# Patient Record
Sex: Male | Born: 1950 | Race: White | Hispanic: No | Marital: Married | Smoking: Never smoker
Health system: Southern US, Community
[De-identification: ages and names within clinical notes are randomized; demographics above are authoritative.]

## PROBLEM LIST (undated history)

## (undated) DIAGNOSIS — E785 Hyperlipidemia, unspecified: Secondary | ICD-10-CM

## (undated) DIAGNOSIS — M1990 Unspecified osteoarthritis, unspecified site: Secondary | ICD-10-CM

## (undated) DIAGNOSIS — I10 Essential (primary) hypertension: Secondary | ICD-10-CM

## (undated) DIAGNOSIS — K429 Umbilical hernia without obstruction or gangrene: Secondary | ICD-10-CM

## (undated) HISTORY — PX: HERNIA REPAIR: SHX51

## (undated) HISTORY — DX: Hyperlipidemia, unspecified: E78.5

## (undated) HISTORY — PX: HAND SURGERY: SHX662

## (undated) HISTORY — PX: APPENDECTOMY: SHX54

## (undated) HISTORY — DX: Umbilical hernia without obstruction or gangrene: K42.9

## (undated) HISTORY — DX: Unspecified osteoarthritis, unspecified site: M19.90

## (undated) HISTORY — PX: STOMACH SURGERY: SHX791

## (undated) HISTORY — PX: WRIST SURGERY: SHX841

## (undated) HISTORY — DX: Essential (primary) hypertension: I10

---

## 1997-10-05 ENCOUNTER — Ambulatory Visit (HOSPITAL_BASED_OUTPATIENT_CLINIC_OR_DEPARTMENT_OTHER): Admission: RE | Admit: 1997-10-05 | Discharge: 1997-10-05 | Payer: Self-pay | Admitting: Orthopedic Surgery

## 1998-04-12 ENCOUNTER — Ambulatory Visit (HOSPITAL_BASED_OUTPATIENT_CLINIC_OR_DEPARTMENT_OTHER): Admission: RE | Admit: 1998-04-12 | Discharge: 1998-04-12 | Payer: Self-pay | Admitting: Orthopedic Surgery

## 2005-05-19 ENCOUNTER — Emergency Department (HOSPITAL_COMMUNITY): Admission: EM | Admit: 2005-05-19 | Discharge: 2005-05-20 | Payer: Self-pay | Admitting: Emergency Medicine

## 2010-12-13 ENCOUNTER — Encounter (INDEPENDENT_AMBULATORY_CARE_PROVIDER_SITE_OTHER): Payer: Self-pay | Admitting: Surgery

## 2010-12-29 ENCOUNTER — Ambulatory Visit (INDEPENDENT_AMBULATORY_CARE_PROVIDER_SITE_OTHER): Payer: Self-pay | Admitting: Surgery

## 2011-01-04 ENCOUNTER — Encounter (INDEPENDENT_AMBULATORY_CARE_PROVIDER_SITE_OTHER): Payer: Self-pay | Admitting: Surgery

## 2011-01-04 ENCOUNTER — Ambulatory Visit (INDEPENDENT_AMBULATORY_CARE_PROVIDER_SITE_OTHER): Payer: 59 | Admitting: Surgery

## 2011-01-04 VITALS — BP 124/80 | HR 68 | Temp 97.1°F | Ht 67.0 in | Wt 211.0 lb

## 2011-01-04 DIAGNOSIS — K429 Umbilical hernia without obstruction or gangrene: Secondary | ICD-10-CM

## 2011-01-04 NOTE — Progress Notes (Addendum)
Chief Complaint  Patient presents with  . Other    new pt- eval umb hernia    Richard Peterson is a 60 y.o. male who is a patient of BURNETT,BRENT A, MD and comes to me today for umbilical hernia.  The patient has had a hernia for 2-3 years. It has gotten steadily larger. It is causing him some symptoms. He's coming to discuss having this hernia fixed.  From a gastrointestinal standpoint he had an intussusception as a 86-year-old child. He has a right paramedian incision for this. He had an appendectomy at age 47 through a low right inguinal incision.  He has no history of peptic ulcer disease, liver disease, pancreatic disease, or colon disease. Has undergone colonoscopies by Dr. Marisue Brooklyn.  Past Medical History  Diagnosis Date  . Umbilical hernia   . Hypertension   . Hyperlipidemia   . Hearing loss   . Arthritis     Past Surgical History  Procedure Date  . Appendectomy   . Hernia repair   . Stomach surgery   . Hand surgery     right  . Wrist surgery     left    Current Outpatient Prescriptions  Medication Sig Dispense Refill  . diltiazem (CARDIZEM CD) 360 MG 24 hr capsule Take 360 mg by mouth daily.        Marland Kitchen losartan-hydrochlorothiazide (HYZAAR) 100-25 MG per tablet Take 1 tablet by mouth daily.        . metolazone (ZAROXOLYN) 5 MG tablet Take 5 mg by mouth daily.        . Potassium Chloride Crys CR (KLOR-CON M20 PO) Take 20 mEq by mouth daily.        . pravastatin (PRAVACHOL) 40 MG tablet Take 40 mg by mouth daily.         Review of Systems: Skin:  No history of rash.  No history of abnormal moles. Infection:  No history of hepatitis or HIV.  No history of MRSA. Neurologic:  No history of stroke.  No history of seizure.  No history of headaches. Cardiac: Hstory of hypertension x 20 years.  No hx of heart disease.  No history of prior cardiac catheterization.  No history of seeing a cardiologist. Pulmonary:  Does not smoke cigarettes.  No asthma or bronchitis.  No  OSA/CPAP.  Endocrine:  No diabetes. No thyroid disease. Hypercholesterolemia. Gastrointestinal:  See HPI. Urologic:  Kidney stones approx 12 years ago.  No history of bladder infections. Musculoskeletal:  Larey Seat from tree stand many years ago and has back but is well tolerated. Hematologic:  No bleeding disorder.  No history of anemia.  Not anticoagulated.   No Known Allergies  SOCIAL and FAMILY HISTORY: Married. Works for [employer].  PHYSICAL EXAM: BP 124/80  Pulse 68  Temp(Src) 97.1 F (36.2 C) (Temporal)  Ht 5\' 7"  (1.702 m)  Wt 211 lb (95.709 kg)  BMI 33.05 kg/m2  HEENT: Normal. Pupils equal. Normal dentition. Neck: Supple. No thyroid mass. Lymph Nodes:  No supraclavicular or cervical nodes. Lungs: Clear and symmetric.  Heart:  RRR. No murmur. Abdomen: No mass. No tenderness. Right paramedian incision (old).  R inguinal scar.  Moderate umbilical hernia with thinned skin.  Normal bowel sounds.   Rectal: Not done. Extremities:  Good strength in upper and lower extremities. Neurologic:  Grossly intact to motor and sensory function. Skin:  Multiple seborrheic keratosis.   DATA REVIEWED: Dr. Mellody Life data.  ASSESSMENT and PLAN: #1. Umbilical hernia. Moderate size  hernia with thin skin over umbilical hernia. I suggest repairing his hernia. I will probably do this open but also discussed laparoscopic approach.  I discussed the indications and complications of hernia surgery with the patient.  I discussed both the laparoscopic and open approach to hernia repair..  The potential risks of hernia surgery include, but are not limited to, bleeding, infection, open surgery, nerve injury, and recurrence of the hernia.  I provided the patient literature about hernia surgery.  He travels a lot with his job.  And we will work around his travels.  #2. Hypertension. #3. Hypercholesterolemia. #4. Remote history kidney stones. #5. 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

## 2011-01-04 NOTE — Patient Instructions (Signed)
To schedule umbilical hernia repair.  Literature given about hernias.

## 2011-01-19 DIAGNOSIS — K420 Umbilical hernia with obstruction, without gangrene: Secondary | ICD-10-CM

## 2011-01-24 ENCOUNTER — Telehealth (INDEPENDENT_AMBULATORY_CARE_PROVIDER_SITE_OTHER): Payer: Self-pay | Admitting: General Surgery

## 2011-01-24 NOTE — Telephone Encounter (Signed)
Patient called s/p umbilical hernia repair last Friday 01/19/11. Patient states he has done everything he was told. He took a shower Monday and took the bandage off. Stated the incision looked good at that point. Patient went back to work yesterday without any heavy lifting. Patient is sore but no significant pain. Patient took a shower this morning and noticed that his umbilical is bulging. Not bulging at incision line, but at his belly button. He can push this area back in. Please advise.

## 2011-01-24 NOTE — Telephone Encounter (Signed)
I called pt back and informed him that this was most likely a fluid collection or seroma.  This should resolve on it's own.  I told him to call us if pain worsens or he has any signs of infection.  He has a follow up on 9-6

## 2011-01-24 NOTE — Telephone Encounter (Signed)
Patient called back upset that he has not heard back from Dr Ezzard Standing or anyone regarding his phone call. After talking to Huntley Dec I told patient that Ezzard Standing had to leave office quickly after clinic and is suppose to call Huntley Dec back and give direction for patient. Patient wasn't happy and said if he doesn't hear anything from [place] today he will be up here in the office when our doors open tomorrow to be seen from someone.

## 2011-01-31 ENCOUNTER — Ambulatory Visit (INDEPENDENT_AMBULATORY_CARE_PROVIDER_SITE_OTHER): Payer: 59 | Admitting: Surgery

## 2011-01-31 ENCOUNTER — Encounter (INDEPENDENT_AMBULATORY_CARE_PROVIDER_SITE_OTHER): Payer: Self-pay | Admitting: Surgery

## 2011-01-31 VITALS — BP 122/76 | HR 77

## 2011-01-31 DIAGNOSIS — Z09 Encounter for follow-up examination after completed treatment for conditions other than malignant neoplasm: Secondary | ICD-10-CM

## 2011-01-31 NOTE — Progress Notes (Signed)
ASSESSMENT AND PLAN: 1. Umbilical hernia, repaired 01/19/2011.  Seroma under skin which should resolve.  Encouraged to wear the abdominal binder for about one month from surgery.  Path benign and copy to patient.  To return if the umbilicus does not flatten out.  2. Hypertension 3. Hypercholesterolemia.  4. Remote history kidney stones.  5. 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 Follow-up    Richard Peterson is a 60 y.o. (DOB: [birth year])  white male who is a patient of BURNETT,BRENT A, MD and comes to me today for follow up of umbilical hernia repair.  Has done well, but about 1 week post op noticed that his skin stuck out.  No pain.  Eating okay.  Only wore the abdominal binder for about 1 week.  PHYSICAL EXAM: BP 122/76  Pulse 77  Abdomen:  Incision well healed.  Has small seroma under skin.  DATA REVIEWED: Path report given to patient.

## 2016-12-25 DIAGNOSIS — E7800 Pure hypercholesterolemia, unspecified: Secondary | ICD-10-CM | POA: Diagnosis not present

## 2016-12-25 DIAGNOSIS — Z0000 Encounter for general adult medical examination without abnormal findings: Secondary | ICD-10-CM | POA: Diagnosis not present

## 2016-12-25 DIAGNOSIS — N529 Male erectile dysfunction, unspecified: Secondary | ICD-10-CM | POA: Diagnosis not present

## 2016-12-25 DIAGNOSIS — Z125 Encounter for screening for malignant neoplasm of prostate: Secondary | ICD-10-CM | POA: Diagnosis not present

## 2016-12-25 DIAGNOSIS — I10 Essential (primary) hypertension: Secondary | ICD-10-CM | POA: Diagnosis not present

## 2017-06-20 DIAGNOSIS — I10 Essential (primary) hypertension: Secondary | ICD-10-CM | POA: Diagnosis not present

## 2017-06-20 DIAGNOSIS — E7800 Pure hypercholesterolemia, unspecified: Secondary | ICD-10-CM | POA: Diagnosis not present

## 2017-06-24 DIAGNOSIS — L989 Disorder of the skin and subcutaneous tissue, unspecified: Secondary | ICD-10-CM | POA: Diagnosis not present

## 2017-06-24 DIAGNOSIS — E7800 Pure hypercholesterolemia, unspecified: Secondary | ICD-10-CM | POA: Diagnosis not present

## 2017-06-24 DIAGNOSIS — I10 Essential (primary) hypertension: Secondary | ICD-10-CM | POA: Diagnosis not present

## 2017-06-24 DIAGNOSIS — N529 Male erectile dysfunction, unspecified: Secondary | ICD-10-CM | POA: Diagnosis not present

## 2017-07-17 DIAGNOSIS — H2513 Age-related nuclear cataract, bilateral: Secondary | ICD-10-CM | POA: Diagnosis not present

## 2017-07-17 DIAGNOSIS — H40013 Open angle with borderline findings, low risk, bilateral: Secondary | ICD-10-CM | POA: Diagnosis not present

## 2017-10-14 DIAGNOSIS — D485 Neoplasm of uncertain behavior of skin: Secondary | ICD-10-CM | POA: Diagnosis not present

## 2017-10-14 DIAGNOSIS — D0462 Carcinoma in situ of skin of left upper limb, including shoulder: Secondary | ICD-10-CM | POA: Diagnosis not present

## 2017-10-14 DIAGNOSIS — D225 Melanocytic nevi of trunk: Secondary | ICD-10-CM | POA: Diagnosis not present

## 2017-10-14 DIAGNOSIS — D044 Carcinoma in situ of skin of scalp and neck: Secondary | ICD-10-CM | POA: Diagnosis not present

## 2017-10-14 DIAGNOSIS — D2261 Melanocytic nevi of right upper limb, including shoulder: Secondary | ICD-10-CM | POA: Diagnosis not present

## 2017-10-14 DIAGNOSIS — D1801 Hemangioma of skin and subcutaneous tissue: Secondary | ICD-10-CM | POA: Diagnosis not present

## 2017-10-14 DIAGNOSIS — L821 Other seborrheic keratosis: Secondary | ICD-10-CM | POA: Diagnosis not present

## 2017-10-14 DIAGNOSIS — L570 Actinic keratosis: Secondary | ICD-10-CM | POA: Diagnosis not present

## 2017-12-16 DIAGNOSIS — L570 Actinic keratosis: Secondary | ICD-10-CM | POA: Diagnosis not present

## 2017-12-16 DIAGNOSIS — D225 Melanocytic nevi of trunk: Secondary | ICD-10-CM | POA: Diagnosis not present

## 2017-12-16 DIAGNOSIS — Z85828 Personal history of other malignant neoplasm of skin: Secondary | ICD-10-CM | POA: Diagnosis not present

## 2017-12-16 DIAGNOSIS — D1801 Hemangioma of skin and subcutaneous tissue: Secondary | ICD-10-CM | POA: Diagnosis not present

## 2017-12-26 DIAGNOSIS — Z125 Encounter for screening for malignant neoplasm of prostate: Secondary | ICD-10-CM | POA: Diagnosis not present

## 2017-12-26 DIAGNOSIS — Z79899 Other long term (current) drug therapy: Secondary | ICD-10-CM | POA: Diagnosis not present

## 2017-12-26 DIAGNOSIS — E7800 Pure hypercholesterolemia, unspecified: Secondary | ICD-10-CM | POA: Diagnosis not present

## 2017-12-26 DIAGNOSIS — R718 Other abnormality of red blood cells: Secondary | ICD-10-CM | POA: Diagnosis not present

## 2017-12-26 DIAGNOSIS — I10 Essential (primary) hypertension: Secondary | ICD-10-CM | POA: Diagnosis not present

## 2017-12-27 DIAGNOSIS — I10 Essential (primary) hypertension: Secondary | ICD-10-CM | POA: Diagnosis not present

## 2017-12-27 DIAGNOSIS — N529 Male erectile dysfunction, unspecified: Secondary | ICD-10-CM | POA: Diagnosis not present

## 2017-12-27 DIAGNOSIS — Z0000 Encounter for general adult medical examination without abnormal findings: Secondary | ICD-10-CM | POA: Diagnosis not present

## 2017-12-27 DIAGNOSIS — Z125 Encounter for screening for malignant neoplasm of prostate: Secondary | ICD-10-CM | POA: Diagnosis not present

## 2017-12-27 DIAGNOSIS — R5383 Other fatigue: Secondary | ICD-10-CM | POA: Diagnosis not present

## 2017-12-27 DIAGNOSIS — E7800 Pure hypercholesterolemia, unspecified: Secondary | ICD-10-CM | POA: Diagnosis not present

## 2018-05-28 HISTORY — PX: CATARACT EXTRACTION W/ INTRAOCULAR LENS  IMPLANT, BILATERAL: SHX1307

## 2018-06-12 DIAGNOSIS — Z86010 Personal history of colonic polyps: Secondary | ICD-10-CM | POA: Diagnosis not present

## 2018-06-12 DIAGNOSIS — Z1211 Encounter for screening for malignant neoplasm of colon: Secondary | ICD-10-CM | POA: Diagnosis not present

## 2018-06-12 DIAGNOSIS — E669 Obesity, unspecified: Secondary | ICD-10-CM | POA: Diagnosis not present

## 2018-06-12 DIAGNOSIS — K5730 Diverticulosis of large intestine without perforation or abscess without bleeding: Secondary | ICD-10-CM | POA: Diagnosis not present

## 2018-06-26 DIAGNOSIS — D2262 Melanocytic nevi of left upper limb, including shoulder: Secondary | ICD-10-CM | POA: Diagnosis not present

## 2018-06-26 DIAGNOSIS — D225 Melanocytic nevi of trunk: Secondary | ICD-10-CM | POA: Diagnosis not present

## 2018-06-26 DIAGNOSIS — L309 Dermatitis, unspecified: Secondary | ICD-10-CM | POA: Diagnosis not present

## 2018-06-26 DIAGNOSIS — L570 Actinic keratosis: Secondary | ICD-10-CM | POA: Diagnosis not present

## 2018-06-26 DIAGNOSIS — L821 Other seborrheic keratosis: Secondary | ICD-10-CM | POA: Diagnosis not present

## 2018-06-26 DIAGNOSIS — Z85828 Personal history of other malignant neoplasm of skin: Secondary | ICD-10-CM | POA: Diagnosis not present

## 2018-06-26 DIAGNOSIS — D2261 Melanocytic nevi of right upper limb, including shoulder: Secondary | ICD-10-CM | POA: Diagnosis not present

## 2018-06-26 DIAGNOSIS — D1801 Hemangioma of skin and subcutaneous tissue: Secondary | ICD-10-CM | POA: Diagnosis not present

## 2018-06-30 DIAGNOSIS — E7800 Pure hypercholesterolemia, unspecified: Secondary | ICD-10-CM | POA: Diagnosis not present

## 2018-06-30 DIAGNOSIS — I10 Essential (primary) hypertension: Secondary | ICD-10-CM | POA: Diagnosis not present

## 2018-06-30 DIAGNOSIS — R5383 Other fatigue: Secondary | ICD-10-CM | POA: Diagnosis not present

## 2018-07-01 DIAGNOSIS — R718 Other abnormality of red blood cells: Secondary | ICD-10-CM | POA: Diagnosis not present

## 2018-07-01 DIAGNOSIS — E7800 Pure hypercholesterolemia, unspecified: Secondary | ICD-10-CM | POA: Diagnosis not present

## 2018-07-01 DIAGNOSIS — N529 Male erectile dysfunction, unspecified: Secondary | ICD-10-CM | POA: Diagnosis not present

## 2018-07-01 DIAGNOSIS — I10 Essential (primary) hypertension: Secondary | ICD-10-CM | POA: Diagnosis not present

## 2018-08-11 DIAGNOSIS — K5730 Diverticulosis of large intestine without perforation or abscess without bleeding: Secondary | ICD-10-CM | POA: Diagnosis not present

## 2018-08-11 DIAGNOSIS — Z1211 Encounter for screening for malignant neoplasm of colon: Secondary | ICD-10-CM | POA: Diagnosis not present

## 2018-08-11 DIAGNOSIS — Z86010 Personal history of colonic polyps: Secondary | ICD-10-CM | POA: Diagnosis not present

## 2018-09-15 DIAGNOSIS — H6123 Impacted cerumen, bilateral: Secondary | ICD-10-CM | POA: Diagnosis not present

## 2018-10-31 DIAGNOSIS — R300 Dysuria: Secondary | ICD-10-CM | POA: Diagnosis not present

## 2018-10-31 DIAGNOSIS — R319 Hematuria, unspecified: Secondary | ICD-10-CM | POA: Diagnosis not present

## 2018-10-31 DIAGNOSIS — M545 Low back pain: Secondary | ICD-10-CM | POA: Diagnosis not present

## 2018-11-18 DIAGNOSIS — M545 Low back pain: Secondary | ICD-10-CM | POA: Diagnosis not present

## 2018-11-20 DIAGNOSIS — M545 Low back pain: Secondary | ICD-10-CM | POA: Diagnosis not present

## 2018-11-20 DIAGNOSIS — M9905 Segmental and somatic dysfunction of pelvic region: Secondary | ICD-10-CM | POA: Diagnosis not present

## 2018-11-20 DIAGNOSIS — M9903 Segmental and somatic dysfunction of lumbar region: Secondary | ICD-10-CM | POA: Diagnosis not present

## 2018-11-20 DIAGNOSIS — M9902 Segmental and somatic dysfunction of thoracic region: Secondary | ICD-10-CM | POA: Diagnosis not present

## 2018-11-25 DIAGNOSIS — M9903 Segmental and somatic dysfunction of lumbar region: Secondary | ICD-10-CM | POA: Diagnosis not present

## 2018-11-25 DIAGNOSIS — M9902 Segmental and somatic dysfunction of thoracic region: Secondary | ICD-10-CM | POA: Diagnosis not present

## 2018-11-25 DIAGNOSIS — M545 Low back pain: Secondary | ICD-10-CM | POA: Diagnosis not present

## 2018-11-25 DIAGNOSIS — M9905 Segmental and somatic dysfunction of pelvic region: Secondary | ICD-10-CM | POA: Diagnosis not present

## 2018-12-26 DIAGNOSIS — R718 Other abnormality of red blood cells: Secondary | ICD-10-CM | POA: Diagnosis not present

## 2018-12-26 DIAGNOSIS — E559 Vitamin D deficiency, unspecified: Secondary | ICD-10-CM | POA: Diagnosis not present

## 2018-12-26 DIAGNOSIS — E7800 Pure hypercholesterolemia, unspecified: Secondary | ICD-10-CM | POA: Diagnosis not present

## 2018-12-26 DIAGNOSIS — I10 Essential (primary) hypertension: Secondary | ICD-10-CM | POA: Diagnosis not present

## 2018-12-30 DIAGNOSIS — Z0000 Encounter for general adult medical examination without abnormal findings: Secondary | ICD-10-CM | POA: Diagnosis not present

## 2018-12-30 DIAGNOSIS — N529 Male erectile dysfunction, unspecified: Secondary | ICD-10-CM | POA: Diagnosis not present

## 2018-12-30 DIAGNOSIS — I10 Essential (primary) hypertension: Secondary | ICD-10-CM | POA: Diagnosis not present

## 2018-12-30 DIAGNOSIS — R718 Other abnormality of red blood cells: Secondary | ICD-10-CM | POA: Diagnosis not present

## 2018-12-30 DIAGNOSIS — Z7289 Other problems related to lifestyle: Secondary | ICD-10-CM | POA: Diagnosis not present

## 2018-12-30 DIAGNOSIS — E7800 Pure hypercholesterolemia, unspecified: Secondary | ICD-10-CM | POA: Diagnosis not present

## 2018-12-30 DIAGNOSIS — E559 Vitamin D deficiency, unspecified: Secondary | ICD-10-CM | POA: Diagnosis not present

## 2019-01-30 DIAGNOSIS — H524 Presbyopia: Secondary | ICD-10-CM | POA: Diagnosis not present

## 2019-01-30 DIAGNOSIS — H40013 Open angle with borderline findings, low risk, bilateral: Secondary | ICD-10-CM | POA: Diagnosis not present

## 2019-01-30 DIAGNOSIS — H2513 Age-related nuclear cataract, bilateral: Secondary | ICD-10-CM | POA: Diagnosis not present

## 2019-02-17 DIAGNOSIS — H25013 Cortical age-related cataract, bilateral: Secondary | ICD-10-CM | POA: Diagnosis not present

## 2019-02-17 DIAGNOSIS — H2511 Age-related nuclear cataract, right eye: Secondary | ICD-10-CM | POA: Diagnosis not present

## 2019-02-17 DIAGNOSIS — H2513 Age-related nuclear cataract, bilateral: Secondary | ICD-10-CM | POA: Diagnosis not present

## 2019-02-17 DIAGNOSIS — H25043 Posterior subcapsular polar age-related cataract, bilateral: Secondary | ICD-10-CM | POA: Diagnosis not present

## 2019-02-17 DIAGNOSIS — H18413 Arcus senilis, bilateral: Secondary | ICD-10-CM | POA: Diagnosis not present

## 2019-03-09 DIAGNOSIS — H2511 Age-related nuclear cataract, right eye: Secondary | ICD-10-CM | POA: Diagnosis not present

## 2019-03-09 DIAGNOSIS — H2513 Age-related nuclear cataract, bilateral: Secondary | ICD-10-CM | POA: Diagnosis not present

## 2019-03-10 DIAGNOSIS — H2512 Age-related nuclear cataract, left eye: Secondary | ICD-10-CM | POA: Diagnosis not present

## 2019-03-23 DIAGNOSIS — H2512 Age-related nuclear cataract, left eye: Secondary | ICD-10-CM | POA: Diagnosis not present

## 2019-03-23 DIAGNOSIS — H2513 Age-related nuclear cataract, bilateral: Secondary | ICD-10-CM | POA: Diagnosis not present

## 2019-04-21 DIAGNOSIS — H20011 Primary iridocyclitis, right eye: Secondary | ICD-10-CM | POA: Diagnosis not present

## 2019-06-29 DIAGNOSIS — E559 Vitamin D deficiency, unspecified: Secondary | ICD-10-CM | POA: Diagnosis not present

## 2019-06-29 DIAGNOSIS — I10 Essential (primary) hypertension: Secondary | ICD-10-CM | POA: Diagnosis not present

## 2019-06-29 DIAGNOSIS — Z125 Encounter for screening for malignant neoplasm of prostate: Secondary | ICD-10-CM | POA: Diagnosis not present

## 2019-06-29 DIAGNOSIS — E7800 Pure hypercholesterolemia, unspecified: Secondary | ICD-10-CM | POA: Diagnosis not present

## 2019-07-01 DIAGNOSIS — R718 Other abnormality of red blood cells: Secondary | ICD-10-CM | POA: Diagnosis not present

## 2019-07-01 DIAGNOSIS — I10 Essential (primary) hypertension: Secondary | ICD-10-CM | POA: Diagnosis not present

## 2019-07-01 DIAGNOSIS — N529 Male erectile dysfunction, unspecified: Secondary | ICD-10-CM | POA: Diagnosis not present

## 2019-07-01 DIAGNOSIS — Z8639 Personal history of other endocrine, nutritional and metabolic disease: Secondary | ICD-10-CM | POA: Diagnosis not present

## 2019-07-01 DIAGNOSIS — E7800 Pure hypercholesterolemia, unspecified: Secondary | ICD-10-CM | POA: Diagnosis not present

## 2019-07-08 DIAGNOSIS — D2262 Melanocytic nevi of left upper limb, including shoulder: Secondary | ICD-10-CM | POA: Diagnosis not present

## 2019-07-08 DIAGNOSIS — L570 Actinic keratosis: Secondary | ICD-10-CM | POA: Diagnosis not present

## 2019-07-08 DIAGNOSIS — L821 Other seborrheic keratosis: Secondary | ICD-10-CM | POA: Diagnosis not present

## 2019-07-08 DIAGNOSIS — D225 Melanocytic nevi of trunk: Secondary | ICD-10-CM | POA: Diagnosis not present

## 2019-07-08 DIAGNOSIS — D485 Neoplasm of uncertain behavior of skin: Secondary | ICD-10-CM | POA: Diagnosis not present

## 2019-07-08 DIAGNOSIS — D2239 Melanocytic nevi of other parts of face: Secondary | ICD-10-CM | POA: Diagnosis not present

## 2019-07-14 DIAGNOSIS — D485 Neoplasm of uncertain behavior of skin: Secondary | ICD-10-CM | POA: Diagnosis not present

## 2019-07-14 DIAGNOSIS — L988 Other specified disorders of the skin and subcutaneous tissue: Secondary | ICD-10-CM | POA: Diagnosis not present

## 2019-12-10 DIAGNOSIS — Z20822 Contact with and (suspected) exposure to covid-19: Secondary | ICD-10-CM | POA: Diagnosis not present

## 2019-12-10 DIAGNOSIS — Z03818 Encounter for observation for suspected exposure to other biological agents ruled out: Secondary | ICD-10-CM | POA: Diagnosis not present

## 2019-12-11 DIAGNOSIS — J069 Acute upper respiratory infection, unspecified: Secondary | ICD-10-CM | POA: Diagnosis not present

## 2019-12-11 DIAGNOSIS — R350 Frequency of micturition: Secondary | ICD-10-CM | POA: Diagnosis not present

## 2019-12-30 DIAGNOSIS — I10 Essential (primary) hypertension: Secondary | ICD-10-CM | POA: Diagnosis not present

## 2019-12-30 DIAGNOSIS — Z1159 Encounter for screening for other viral diseases: Secondary | ICD-10-CM | POA: Diagnosis not present

## 2019-12-30 DIAGNOSIS — E559 Vitamin D deficiency, unspecified: Secondary | ICD-10-CM | POA: Diagnosis not present

## 2019-12-30 DIAGNOSIS — Z0000 Encounter for general adult medical examination without abnormal findings: Secondary | ICD-10-CM | POA: Diagnosis not present

## 2019-12-30 DIAGNOSIS — N529 Male erectile dysfunction, unspecified: Secondary | ICD-10-CM | POA: Diagnosis not present

## 2019-12-30 DIAGNOSIS — R718 Other abnormality of red blood cells: Secondary | ICD-10-CM | POA: Diagnosis not present

## 2019-12-30 DIAGNOSIS — E7800 Pure hypercholesterolemia, unspecified: Secondary | ICD-10-CM | POA: Diagnosis not present

## 2020-06-20 ENCOUNTER — Other Ambulatory Visit: Payer: Self-pay | Admitting: Physician Assistant

## 2020-06-20 DIAGNOSIS — R1905 Periumbilic swelling, mass or lump: Secondary | ICD-10-CM

## 2020-06-21 ENCOUNTER — Ambulatory Visit
Admission: RE | Admit: 2020-06-21 | Discharge: 2020-06-21 | Disposition: A | Discharge status: Home / Self Care | Payer: PPO | Source: Ambulatory Visit | Attending: Physician Assistant | Admitting: Physician Assistant

## 2020-06-21 DIAGNOSIS — K429 Umbilical hernia without obstruction or gangrene: Secondary | ICD-10-CM | POA: Diagnosis not present

## 2020-06-21 DIAGNOSIS — R1905 Periumbilic swelling, mass or lump: Secondary | ICD-10-CM

## 2020-07-07 DIAGNOSIS — L570 Actinic keratosis: Secondary | ICD-10-CM | POA: Diagnosis not present

## 2020-07-07 DIAGNOSIS — D1801 Hemangioma of skin and subcutaneous tissue: Secondary | ICD-10-CM | POA: Diagnosis not present

## 2020-07-07 DIAGNOSIS — D2262 Melanocytic nevi of left upper limb, including shoulder: Secondary | ICD-10-CM | POA: Diagnosis not present

## 2020-07-07 DIAGNOSIS — L821 Other seborrheic keratosis: Secondary | ICD-10-CM | POA: Diagnosis not present

## 2020-07-07 DIAGNOSIS — D485 Neoplasm of uncertain behavior of skin: Secondary | ICD-10-CM | POA: Diagnosis not present

## 2020-07-07 DIAGNOSIS — D225 Melanocytic nevi of trunk: Secondary | ICD-10-CM | POA: Diagnosis not present

## 2020-07-07 DIAGNOSIS — D2261 Melanocytic nevi of right upper limb, including shoulder: Secondary | ICD-10-CM | POA: Diagnosis not present

## 2020-07-14 DIAGNOSIS — R1905 Periumbilic swelling, mass or lump: Secondary | ICD-10-CM | POA: Diagnosis not present

## 2020-07-19 ENCOUNTER — Other Ambulatory Visit: Payer: Self-pay | Admitting: General Surgery

## 2020-07-19 DIAGNOSIS — R1905 Periumbilic swelling, mass or lump: Secondary | ICD-10-CM

## 2020-07-26 DIAGNOSIS — E559 Vitamin D deficiency, unspecified: Secondary | ICD-10-CM | POA: Diagnosis not present

## 2020-07-26 DIAGNOSIS — N529 Male erectile dysfunction, unspecified: Secondary | ICD-10-CM | POA: Diagnosis not present

## 2020-07-26 DIAGNOSIS — I10 Essential (primary) hypertension: Secondary | ICD-10-CM | POA: Diagnosis not present

## 2020-07-26 DIAGNOSIS — R718 Other abnormality of red blood cells: Secondary | ICD-10-CM | POA: Diagnosis not present

## 2020-07-26 DIAGNOSIS — E7800 Pure hypercholesterolemia, unspecified: Secondary | ICD-10-CM | POA: Diagnosis not present

## 2020-07-26 DIAGNOSIS — Z125 Encounter for screening for malignant neoplasm of prostate: Secondary | ICD-10-CM | POA: Diagnosis not present

## 2020-08-04 ENCOUNTER — Other Ambulatory Visit: Payer: Self-pay

## 2020-08-04 ENCOUNTER — Ambulatory Visit
Admission: RE | Admit: 2020-08-04 | Discharge: 2020-08-04 | Disposition: A | Discharge status: Home / Self Care | Payer: PPO | Source: Ambulatory Visit | Attending: General Surgery | Admitting: General Surgery

## 2020-08-04 DIAGNOSIS — R1905 Periumbilic swelling, mass or lump: Secondary | ICD-10-CM

## 2020-08-04 DIAGNOSIS — N1330 Unspecified hydronephrosis: Secondary | ICD-10-CM | POA: Diagnosis not present

## 2020-09-20 DIAGNOSIS — N130 Hydronephrosis with ureteropelvic junction obstruction: Secondary | ICD-10-CM | POA: Diagnosis not present

## 2020-09-20 DIAGNOSIS — N312 Flaccid neuropathic bladder, not elsewhere classified: Secondary | ICD-10-CM | POA: Diagnosis not present

## 2020-09-22 DIAGNOSIS — R310 Gross hematuria: Secondary | ICD-10-CM | POA: Diagnosis not present

## 2020-09-22 DIAGNOSIS — R3914 Feeling of incomplete bladder emptying: Secondary | ICD-10-CM | POA: Diagnosis not present

## 2020-10-10 DIAGNOSIS — R338 Other retention of urine: Secondary | ICD-10-CM | POA: Diagnosis not present

## 2020-10-17 DIAGNOSIS — N401 Enlarged prostate with lower urinary tract symptoms: Secondary | ICD-10-CM | POA: Diagnosis not present

## 2020-10-17 DIAGNOSIS — N130 Hydronephrosis with ureteropelvic junction obstruction: Secondary | ICD-10-CM | POA: Diagnosis not present

## 2020-10-17 DIAGNOSIS — R3914 Feeling of incomplete bladder emptying: Secondary | ICD-10-CM | POA: Diagnosis not present

## 2020-10-26 ENCOUNTER — Other Ambulatory Visit: Payer: Self-pay | Admitting: Urology

## 2020-10-26 DIAGNOSIS — I959 Hypotension, unspecified: Secondary | ICD-10-CM | POA: Diagnosis not present

## 2020-11-04 ENCOUNTER — Encounter (HOSPITAL_BASED_OUTPATIENT_CLINIC_OR_DEPARTMENT_OTHER): Payer: Self-pay | Admitting: Urology

## 2020-11-04 ENCOUNTER — Other Ambulatory Visit: Payer: Self-pay

## 2020-11-04 NOTE — Progress Notes (Signed)
Spoke w/ via phone for pre-op interview--- Pt Lab needs dos----  no             Lab results------ pt getting CBC, BMP, EKG done 11-07-2020 @ 0930 COVID test--- 11-07-2020 @ 0840 (pt going @ 0800) Arrive at -------  0830 on 11-09-2020 NPO after MN NO Solid Food.  Clear liquids from MN until--- 0730 Med rec completed Medications to take morning of surgery ----- Pravastatin Diabetic medication ----- n/a Patient instructed no nail polish to be worn day of surgery Patient instructed to bring photo id and insurance card day of surgery Patient aware to have Driver (ride ) / caregiver for 24 hours after surgery -- wife, [facility] Patient Special Instructions ----- reviewed [facility] and visitor guidelines Pre-Op special Istructions ----- n/a Patient verbalized understanding of instructions that were given at this phone interview. Patient denies shortness of breath, chest pain, fever, cough at this phone interview.

## 2020-11-07 ENCOUNTER — Other Ambulatory Visit: Payer: Self-pay

## 2020-11-07 ENCOUNTER — Other Ambulatory Visit (HOSPITAL_COMMUNITY)
Admission: RE | Admit: 2020-11-07 | Discharge: 2020-11-07 | Disposition: A | Discharge status: Home / Self Care | Payer: PPO | Source: Ambulatory Visit | Attending: Urology | Admitting: Urology

## 2020-11-07 ENCOUNTER — Encounter (HOSPITAL_COMMUNITY)
Admission: RE | Admit: 2020-11-07 | Discharge: 2020-11-07 | Disposition: A | Discharge status: Home / Self Care | Payer: PPO | Source: Ambulatory Visit | Attending: Urology | Admitting: Urology

## 2020-11-07 DIAGNOSIS — Z20822 Contact with and (suspected) exposure to covid-19: Secondary | ICD-10-CM | POA: Diagnosis not present

## 2020-11-07 DIAGNOSIS — Z01818 Encounter for other preprocedural examination: Secondary | ICD-10-CM | POA: Diagnosis not present

## 2020-11-07 LAB — BASIC METABOLIC PANEL
Anion gap: 7 (ref 5–15)
BUN: 14 mg/dL (ref 8–23)
CO2: 34 mmol/L — ABNORMAL HIGH (ref 22–32)
Calcium: 9.9 mg/dL (ref 8.9–10.3)
Chloride: 95 mmol/L — ABNORMAL LOW (ref 98–111)
Creatinine, Ser: 1.12 mg/dL (ref 0.61–1.24)
GFR, Estimated: >60 mL/min (ref >60)
Glucose, Bld: 86 mg/dL (ref 70–99)
Potassium: 4.1 mmol/L (ref 3.5–5.1)
Sodium: 136 mmol/L (ref 135–145)

## 2020-11-07 LAB — CBC
HCT: 41.9 % (ref 39.0–52.0)
Hemoglobin: 14.5 g/dL (ref 13.0–17.0)
MCH: 34.7 pg — ABNORMAL HIGH (ref 26.0–34.0)
MCHC: 34.6 g/dL (ref 30.0–36.0)
MCV: 100.2 fL — ABNORMAL HIGH (ref 80.0–100.0)
Platelets: 202 10*3/uL (ref 150–400)
RBC: 4.18 MIL/uL — ABNORMAL LOW (ref 4.22–5.81)
RDW: 12.7 % (ref 11.5–15.5)
WBC: 4.7 10*3/uL (ref 4.0–10.5)
nRBC: 0 % (ref 0.0–0.2)

## 2020-11-07 LAB — SARS CORONAVIRUS 2 (TAT 6-24 HRS): SARS Coronavirus 2: NEGATIVE

## 2020-11-09 ENCOUNTER — Other Ambulatory Visit: Payer: Self-pay

## 2020-11-09 ENCOUNTER — Ambulatory Visit (HOSPITAL_BASED_OUTPATIENT_CLINIC_OR_DEPARTMENT_OTHER): Payer: PPO | Admitting: Anesthesiology

## 2020-11-09 ENCOUNTER — Encounter (HOSPITAL_BASED_OUTPATIENT_CLINIC_OR_DEPARTMENT_OTHER): Payer: Self-pay | Admitting: Urology

## 2020-11-09 ENCOUNTER — Observation Stay (HOSPITAL_BASED_OUTPATIENT_CLINIC_OR_DEPARTMENT_OTHER)
Admission: RE | Admit: 2020-11-09 | Discharge: 2020-11-10 | Disposition: A | Discharge status: Home / Self Care | Payer: PPO | Attending: Urology | Admitting: Urology

## 2020-11-09 ENCOUNTER — Encounter (HOSPITAL_BASED_OUTPATIENT_CLINIC_OR_DEPARTMENT_OTHER)
Admission: RE | Disposition: A | Discharge status: Home / Self Care | Payer: Self-pay | Source: Home / Self Care | Attending: Urology

## 2020-11-09 DIAGNOSIS — N3080 Other cystitis without hematuria: Secondary | ICD-10-CM | POA: Diagnosis not present

## 2020-11-09 DIAGNOSIS — D304 Benign neoplasm of urethra: Secondary | ICD-10-CM | POA: Diagnosis not present

## 2020-11-09 DIAGNOSIS — I10 Essential (primary) hypertension: Secondary | ICD-10-CM | POA: Diagnosis not present

## 2020-11-09 DIAGNOSIS — R3914 Feeling of incomplete bladder emptying: Secondary | ICD-10-CM | POA: Diagnosis not present

## 2020-11-09 DIAGNOSIS — N130 Hydronephrosis with ureteropelvic junction obstruction: Secondary | ICD-10-CM | POA: Diagnosis not present

## 2020-11-09 DIAGNOSIS — Z79899 Other long term (current) drug therapy: Secondary | ICD-10-CM | POA: Diagnosis not present

## 2020-11-09 DIAGNOSIS — N400 Enlarged prostate without lower urinary tract symptoms: Secondary | ICD-10-CM | POA: Diagnosis not present

## 2020-11-09 DIAGNOSIS — N401 Enlarged prostate with lower urinary tract symptoms: Secondary | ICD-10-CM | POA: Diagnosis not present

## 2020-11-09 DIAGNOSIS — N529 Male erectile dysfunction, unspecified: Secondary | ICD-10-CM | POA: Diagnosis not present

## 2020-11-09 DIAGNOSIS — R338 Other retention of urine: Secondary | ICD-10-CM | POA: Diagnosis not present

## 2020-11-09 DIAGNOSIS — E785 Hyperlipidemia, unspecified: Secondary | ICD-10-CM | POA: Diagnosis not present

## 2020-11-09 DIAGNOSIS — N3289 Other specified disorders of bladder: Secondary | ICD-10-CM | POA: Diagnosis not present

## 2020-11-09 HISTORY — PX: TRANSURETHRAL RESECTION OF PROSTATE: SHX73

## 2020-11-09 SURGERY — TURP (TRANSURETHRAL RESECTION OF PROSTATE)
Anesthesia: General | Site: Prostate

## 2020-11-09 MED ORDER — FENTANYL CITRATE (PF) 100 MCG/2ML IJ SOLN
25.0000 ug | INTRAMUSCULAR | Status: DC | PRN
  Administered 2020-11-09: 25 ug via INTRAVENOUS

## 2020-11-09 MED ORDER — BELLADONNA ALKALOIDS-OPIUM 16.2-60 MG RE SUPP
RECTAL | Status: DC | PRN
  Administered 2020-11-09: 1 via RECTAL

## 2020-11-09 MED ORDER — GLYCOPYRROLATE PF 0.2 MG/ML IJ SOSY
PREFILLED_SYRINGE | INTRAMUSCULAR | Status: AC
  Filled 2020-11-09: qty 1

## 2020-11-09 MED ORDER — SODIUM CHLORIDE 0.9 % IV SOLN: INTRAVENOUS | Status: DC

## 2020-11-09 MED ORDER — LIDOCAINE HCL (PF) 2 % IJ SOLN
INTRAMUSCULAR | Status: AC
  Filled 2020-11-09: qty 5

## 2020-11-09 MED ORDER — PROPOFOL 10 MG/ML IV BOLUS
INTRAVENOUS | Status: DC | PRN
  Administered 2020-11-09: 150 mg via INTRAVENOUS
  Administered 2020-11-09: 30 mg via INTRAVENOUS
  Administered 2020-11-09: 20 mg via INTRAVENOUS

## 2020-11-09 MED ORDER — BELLADONNA ALKALOIDS-OPIUM 16.2-60 MG RE SUPP
RECTAL | Status: AC
  Filled 2020-11-09: qty 1

## 2020-11-09 MED ORDER — DEXAMETHASONE SODIUM PHOSPHATE 10 MG/ML IJ SOLN
INTRAMUSCULAR | Status: AC
  Filled 2020-11-09: qty 1

## 2020-11-09 MED ORDER — BELLADONNA ALKALOIDS-OPIUM 16.2-60 MG RE SUPP: 1.0000 | Freq: Four times a day (QID) | RECTAL | Status: DC | PRN

## 2020-11-09 MED ORDER — GLYCOPYRROLATE 0.2 MG/ML IJ SOLN
INTRAMUSCULAR | Status: DC | PRN
  Administered 2020-11-09: .2 mg via INTRAVENOUS

## 2020-11-09 MED ORDER — FENTANYL CITRATE (PF) 100 MCG/2ML IJ SOLN
INTRAMUSCULAR | Status: DC | PRN
  Administered 2020-11-09: 50 ug via INTRAVENOUS
  Administered 2020-11-09 (×2): 25 ug via INTRAVENOUS

## 2020-11-09 MED ORDER — DIPHENHYDRAMINE HCL 50 MG/ML IJ SOLN: 12.5000 mg | Freq: Four times a day (QID) | INTRAMUSCULAR | Status: DC | PRN

## 2020-11-09 MED ORDER — SODIUM CHLORIDE 0.9 % IR SOLN: 3000.0000 mL | Status: DC

## 2020-11-09 MED ORDER — ZOLPIDEM TARTRATE 5 MG PO TABS
ORAL_TABLET | ORAL | Status: AC
  Filled 2020-11-09: qty 1

## 2020-11-09 MED ORDER — ZOLPIDEM TARTRATE 5 MG PO TABS
5.0000 mg | ORAL_TABLET | Freq: Every evening | ORAL | Status: DC | PRN
  Administered 2020-11-09: 5 mg via ORAL

## 2020-11-09 MED ORDER — ACETAMINOPHEN 325 MG PO TABS
650.0000 mg | ORAL_TABLET | ORAL | Status: DC | PRN
  Administered 2020-11-09 – 2020-11-10 (×3): 650 mg via ORAL

## 2020-11-09 MED ORDER — FENTANYL CITRATE (PF) 100 MCG/2ML IJ SOLN
INTRAMUSCULAR | Status: AC
  Filled 2020-11-09: qty 2

## 2020-11-09 MED ORDER — CIPROFLOXACIN IN D5W 400 MG/200ML IV SOLN
400.0000 mg | Freq: Once | INTRAVENOUS | Status: AC
  Administered 2020-11-09: 400 mg via INTRAVENOUS

## 2020-11-09 MED ORDER — ONDANSETRON HCL 4 MG/2ML IJ SOLN
INTRAMUSCULAR | Status: DC | PRN
  Administered 2020-11-09: 4 mg via INTRAVENOUS

## 2020-11-09 MED ORDER — DEXAMETHASONE SODIUM PHOSPHATE 4 MG/ML IJ SOLN
INTRAMUSCULAR | Status: DC | PRN
  Administered 2020-11-09: 10 mg via INTRAVENOUS

## 2020-11-09 MED ORDER — ACETAMINOPHEN 500 MG PO TABS
1000.0000 mg | ORAL_TABLET | Freq: Once | ORAL | Status: AC
  Administered 2020-11-09: 1000 mg via ORAL

## 2020-11-09 MED ORDER — ACETAMINOPHEN 500 MG PO TABS
ORAL_TABLET | ORAL | Status: AC
  Filled 2020-11-09: qty 1

## 2020-11-09 MED ORDER — EPHEDRINE SULFATE 50 MG/ML IJ SOLN
INTRAMUSCULAR | Status: DC | PRN
  Administered 2020-11-09: 5 mg via INTRAVENOUS
  Administered 2020-11-09: 10 mg via INTRAVENOUS

## 2020-11-09 MED ORDER — HYDROMORPHONE HCL 1 MG/ML IJ SOLN: 0.5000 mg | INTRAMUSCULAR | Status: DC | PRN

## 2020-11-09 MED ORDER — ACETAMINOPHEN 325 MG PO TABS
ORAL_TABLET | ORAL | Status: AC
  Filled 2020-11-09: qty 2

## 2020-11-09 MED ORDER — LISINOPRIL 20 MG PO TABS
20.0000 mg | ORAL_TABLET | Freq: Every day | ORAL | Status: DC
  Administered 2020-11-09: 20 mg via ORAL
  Filled 2020-11-09: qty 1

## 2020-11-09 MED ORDER — TAMSULOSIN HCL 0.4 MG PO CAPS
0.4000 mg | ORAL_CAPSULE | Freq: Every day | ORAL | Status: DC
  Administered 2020-11-10: 0.4 mg via ORAL

## 2020-11-09 MED ORDER — CIPROFLOXACIN IN D5W 400 MG/200ML IV SOLN
INTRAVENOUS | Status: AC
  Filled 2020-11-09: qty 200

## 2020-11-09 MED ORDER — OXYBUTYNIN CHLORIDE 5 MG PO TABS: 5.0000 mg | ORAL_TABLET | Freq: Three times a day (TID) | ORAL | Status: DC | PRN

## 2020-11-09 MED ORDER — CIPROFLOXACIN HCL 500 MG PO TABS
ORAL_TABLET | ORAL | Status: AC
  Filled 2020-11-09: qty 1

## 2020-11-09 MED ORDER — ONDANSETRON HCL 4 MG/2ML IJ SOLN
INTRAMUSCULAR | Status: AC
  Filled 2020-11-09: qty 2

## 2020-11-09 MED ORDER — HYDROCODONE-ACETAMINOPHEN 5-325 MG PO TABS: 1.0000 | ORAL_TABLET | ORAL | Status: DC | PRN

## 2020-11-09 MED ORDER — DIPHENHYDRAMINE HCL 25 MG PO CAPS: 50.0000 mg | ORAL_CAPSULE | Freq: Every day | ORAL | Status: DC

## 2020-11-09 MED ORDER — ACETAMINOPHEN 325 MG PO TABS
ORAL_TABLET | ORAL | Status: AC
  Filled 2020-11-09: qty 1

## 2020-11-09 MED ORDER — PHENYLEPHRINE 40 MCG/ML (10ML) SYRINGE FOR IV PUSH (FOR BLOOD PRESSURE SUPPORT)
PREFILLED_SYRINGE | INTRAVENOUS | Status: DC | PRN
  Administered 2020-11-09: 80 ug via INTRAVENOUS
  Administered 2020-11-09: 120 ug via INTRAVENOUS

## 2020-11-09 MED ORDER — SODIUM CHLORIDE 0.9 % IR SOLN
Status: DC | PRN
  Administered 2020-11-09 (×2): 6000 mL via INTRAVESICAL
  Administered 2020-11-09: 3000 mL via INTRAVESICAL
  Administered 2020-11-09: 1000 mL via INTRAVESICAL

## 2020-11-09 MED ORDER — CIPROFLOXACIN HCL 500 MG PO TABS
500.0000 mg | ORAL_TABLET | Freq: Two times a day (BID) | ORAL | Status: DC
  Administered 2020-11-09 – 2020-11-10 (×3): 500 mg via ORAL

## 2020-11-09 MED ORDER — PRAVASTATIN SODIUM 40 MG PO TABS
40.0000 mg | ORAL_TABLET | Freq: Every day | ORAL | Status: DC
Start: 2020-11-10 — End: 2020-11-10
  Filled 2020-11-09: qty 1

## 2020-11-09 MED ORDER — LISINOPRIL-HYDROCHLOROTHIAZIDE 20-25 MG PO TABS
1.0000 | ORAL_TABLET | Freq: Every day | ORAL | Status: DC
Start: 2020-11-09 — End: 2020-11-09

## 2020-11-09 MED ORDER — ONDANSETRON HCL 4 MG/2ML IJ SOLN: 4.0000 mg | INTRAMUSCULAR | Status: DC | PRN

## 2020-11-09 MED ORDER — PROPOFOL 10 MG/ML IV BOLUS
INTRAVENOUS | Status: AC
  Filled 2020-11-09: qty 20

## 2020-11-09 MED ORDER — DIPHENHYDRAMINE HCL 12.5 MG/5ML PO ELIX: 12.5000 mg | ORAL_SOLUTION | Freq: Four times a day (QID) | ORAL | Status: DC | PRN

## 2020-11-09 MED ORDER — HYDROCHLOROTHIAZIDE 25 MG PO TABS
25.0000 mg | ORAL_TABLET | Freq: Every day | ORAL | Status: DC
  Administered 2020-11-09: 25 mg via ORAL
  Filled 2020-11-09: qty 1

## 2020-11-09 MED ORDER — LACTATED RINGERS IV SOLN: INTRAVENOUS | Status: DC

## 2020-11-09 MED ORDER — LIDOCAINE HCL (CARDIAC) PF 100 MG/5ML IV SOSY
PREFILLED_SYRINGE | INTRAVENOUS | Status: DC | PRN
  Administered 2020-11-09: 60 mg via INTRAVENOUS

## 2020-11-09 SURGICAL SUPPLY — 25 items
BAG DRAIN URO-CYSTO SKYTR STRL (DRAIN) ×3 IMPLANT
BAG DRN RND TRDRP ANRFLXCHMBR (UROLOGICAL SUPPLIES) ×1
BAG DRN UROCATH (DRAIN) ×1
BAG URINE DRAIN 2000ML AR STRL (UROLOGICAL SUPPLIES) ×3 IMPLANT
BAND INSRT 18 STRL LF DISP RB (MISCELLANEOUS)
BAND RUBBER #18 3X1/16 STRL (MISCELLANEOUS) IMPLANT
CATH FOLEY 3WAY 30CC 22FR (CATHETERS) IMPLANT
CATH FOLEY 3WAY 30CC 24FR (CATHETERS) ×3
CATH URTH STD 24FR FL 3W 2 (CATHETERS) ×1 IMPLANT
DRSG TELFA 3X8 NADH (GAUZE/BANDAGES/DRESSINGS) ×3 IMPLANT
GLOVE SURG ENC MOIS LTX SZ7.5 (GLOVE) ×3 IMPLANT
GOWN STRL REUS W/TWL XL LVL3 (GOWN DISPOSABLE) ×3 IMPLANT
HOLDER FOLEY CATH W/STRAP (MISCELLANEOUS) ×3 IMPLANT
IV NS IRRIG 3000ML ARTHROMATIC (IV SOLUTION) ×24 IMPLANT
KIT TURNOVER CYSTO (KITS) ×3 IMPLANT
LOOP CUT BIPOLAR 24F LRG (ELECTROSURGICAL) ×3 IMPLANT
MANIFOLD NEPTUNE II (INSTRUMENTS) ×3 IMPLANT
PACK CYSTO (CUSTOM PROCEDURE TRAY) ×3 IMPLANT
PIN SAFETY STERILE (MISCELLANEOUS) ×3 IMPLANT
SYR 30ML LL (SYRINGE) ×3 IMPLANT
SYR TOOMEY IRRIG 70ML (MISCELLANEOUS) ×3
SYRINGE TOOMEY IRRIG 70ML (MISCELLANEOUS) ×1 IMPLANT
TUBE CONNECTING 12'X1/4 (SUCTIONS) ×1
TUBE CONNECTING 12X1/4 (SUCTIONS) ×2 IMPLANT
TUBING UROLOGY SET (TUBING) ×3 IMPLANT

## 2020-11-09 NOTE — Anesthesia Preprocedure Evaluation (Addendum)
Anesthesia Evaluation  Patient identified by MRN, date of birth, ID band Patient awake    Reviewed: Allergy & Precautions, H&P , NPO status , Patient's Chart, lab work & pertinent test results  Airway Mallampati: I  TM Distance: >3 FB Neck ROM: Full    Dental no notable dental hx. (+) Teeth Intact, Dental Advisory Given   Pulmonary neg pulmonary ROS,    Pulmonary exam normal breath sounds clear to auscultation       Cardiovascular hypertension, Pt. on medications  Rhythm:Regular Rate:Normal     Neuro/Psych negative neurological ROS  negative psych ROS   GI/Hepatic negative GI ROS, Neg liver ROS,   Endo/Other  negative endocrine ROS  Renal/GU negative Renal ROS  negative genitourinary   Musculoskeletal  (+) Arthritis ,   Abdominal   Peds  Hematology negative hematology ROS (+)   Anesthesia Other Findings   Reproductive/Obstetrics negative OB ROS                            Anesthesia Physical Anesthesia Plan  ASA: 2  Anesthesia Plan: General   Post-op Pain Management:    Induction: Intravenous  PONV Risk Score and Plan: 3 and Ondansetron, Dexamethasone and Treatment may vary due to age or medical condition  Airway Management Planned: LMA  Additional Equipment:   Intra-op Plan:   Post-operative Plan: Extubation in OR  Informed Consent: I have reviewed the patients History and Physical, chart, labs and discussed the procedure including the risks, benefits and alternatives for the proposed anesthesia with the patient or authorized representative who has indicated his/her understanding and acceptance.     Dental advisory given  Plan Discussed with: CRNA  Anesthesia Plan Comments:         Anesthesia Quick Evaluation

## 2020-11-09 NOTE — Anesthesia Postprocedure Evaluation (Signed)
Anesthesia Post Note  Patient: Richard Peterson.  Procedure(s) Performed: TRANSURETHRAL RESECTION OF THE PROSTATE (TURP)/ BIPOLAR (Prostate)     Patient location during evaluation: PACU Anesthesia Type: General Level of consciousness: awake and alert Pain management: pain level controlled Vital Signs Assessment: post-procedure vital signs reviewed and stable Respiratory status: spontaneous breathing, nonlabored ventilation and respiratory function stable Cardiovascular status: blood pressure returned to baseline and stable Postop Assessment: no apparent nausea or vomiting Anesthetic complications: no   No notable events documented.  Last Vitals:  Vitals:   11/09/20 1339 11/09/20 1351  BP: (!) 143/86 (!) 165/88  Pulse: 66 71  Resp: 12 14  Temp:  36.5 C  SpO2: 98% 97%    Last Pain:  Vitals:   11/09/20 1351  TempSrc: Oral  PainSc:                  Richard Peterson,W. EDMOND

## 2020-11-09 NOTE — Anesthesia Procedure Notes (Signed)
Procedure Name: LMA Insertion Date/Time: 11/09/2020 11:36 AM Performed by: Mechele Claude, CRNA Pre-anesthesia Checklist: Patient identified, Emergency Drugs available, Suction available and Patient being monitored Patient Re-evaluated:Patient Re-evaluated prior to induction Oxygen Delivery Method: Circle system utilized Preoxygenation: Pre-oxygenation with 100% oxygen Induction Type: IV induction Ventilation: Mask ventilation without difficulty LMA: LMA inserted LMA Size: 4.0 Number of attempts: 1 Airway Equipment and Method: Bite block Placement Confirmation: positive ETCO2 Tube secured with: Tape Dental Injury: Teeth and Oropharynx as per pre-operative assessment

## 2020-11-09 NOTE — Op Note (Signed)
Operative Note  Preoperative diagnosis:  1.  BPH with bladder outlet obstruction  Postoperative diagnosis: 1.  BPH with bladder outlet obstruction 2.  Diffuse papillary mucosal changes likely representing mucosal edema versus small papillary tumors  Procedure(s): 1.  Bipolar TURP  Surgeon: Ellison Hughs, MD  Assistants:  None  Anesthesia:  General  Complications:  None  EBL: 50 mL  Specimens: 1. Prostate chips 2.  Bladder biopsy 3.  Prostatic urethral biopsy  Drains/Catheters: 1.  39 French three-way Foley catheter with 30 mL in the balloon  Intraoperative findings:   Cystoscopy revealed diffuse mucosal edema versus small papillary bladder tumors throughout the bladder along with a small focus in the prostatic urethra Moderate to severe bladder trabeculation  Indication:  Richard Peterson. is a 70 y.o. male who was initially referred due to bilateral hydronephrosis along with a diffusely distended bladder.  He was evaluated in the office and found to have a PVR greater than 999 mL.  He has remote history of gross hematuria and had a negative CT and cystoscopy in 2011.  He also has a prior history of UTIs with his last one being approximately 10 years ago.  Due to his profoundly elevated PVR, he had an indwelling Foley catheter placed leading up to surgery.  He is here today for TURP.  He has been consented for the above procedures, voices understanding and wishes to proceed.  Description of procedure:  After informed consent was obtained, the patient was brought to the operating room and general anesthesia was administered. The patient was then placed in the dorsolithotomy position and prepped and draped in usual sterile fashion. A timeout was performed. A 23 French rigid cystoscope was then inserted into the urethral meatus and advanced into the bladder under direct vision. A complete bladder survey revealed the findings listed above.  Both ureteral orifices were  identified and well away from the bladder neck. The bipolar loop was then used to obtain a bladder biopsy along the posterior bladder wall as well as within the prostatic urethra.  The specimens were passed off and sent to pathology for permanent section  The rigid cystoscope was then exchanged for a 26 French resectoscope with a bipolar loop working element.  Starting at the bladder neck and progressing distally to the verumontanum, the prostatic adenoma was systematically resected until a widely patent prostatic urethral channel was created.  All prostate chips were then hand irrigated out of the bladder and sent to pathology for permanent section.  The resectoscope was then removed and exchanged for a 22 French three-way Foley catheter.  The three-way Foley catheter was then extensively hand irrigated until the irrigant returned clear to light pink.  The catheter was then placed to continuous bladder irrigation and placed on rubber band traction.  A belladonna and opium suppository was placed prior to the cessation of anesthesia.  He tolerated the procedure well and was transferred to the postanesthesia unit in stable condition.  Plan:  CBI overnight

## 2020-11-09 NOTE — H&P (Signed)
PRE-OP H&P  Office Visit Report     10/17/2020   --------------------------------------------------------------------------------   Richard Peterson  MRN: [id number]  DOB: 10/16/1950, 70 year old Male  PRIMARY CARE:  [facility], [place]  REFERRING:  Georgette Dover, MD  PROVIDER:  Ellison Hughs, M.D.  LOCATION:  [facility] [phone]     --------------------------------------------------------------------------------   CC/HPI: CC: bladder distention   HPI:   09/20/2020: [name] is a 70 year old male referred by Dr. Marlou Starks after he was found to have profound bladder distention and bilateral hydronephrosis on CT from 08/05/20.   -The patient was last seen in 2011 for an evaluation of gross hematuria, which was ultimately negative. At that time, he was found to have a PVR greater than 300 mL.  -Over the past 10+ years, the patient reports a fluctuating force of stream, but denies any sensation of incomplete bladder emptying. He reports a "kidney infection" approximately 10 years ago, but none since. He denies any interval episodes of dysuria or gross hematuria. UA today is unremarkable.  -3 month history of abdominal distention/fullness  -Hx of cervical spine fracture ~30 years ago with sporadic episodes of parathesias in his fingers--otherwise no significant neurologic issues.  -No prior history of GU surgery or trauma   PVR >967mL. Foley placed.   10/17/20: The patient is here today for a routine follow-up. His recent UDS study showed a compliant, very large capacity bladder with adequate detrusor contractility. Unfortunately, the patient was not able to generate a strong enough contraction to voluntarily void and required replacement of his Foley catheter. He denies any interval issues with discomfort around the catheter or with gross hematuria. He also denies flank pain, nausea/vomiting or fever/chills.   Last PSA: 0.83 (07/26/2020)  Last serum  creatinine: 1.3 (07/26/2020)     ALLERGIES: No Allergies    MEDICATIONS: Bactrim Ds 800 mg-160 mg tablet 1 tablet PO BID  Metoprolol Tartrate 50 mg tablet  Tamsulosin Hcl 0.4 mg capsule 1 capsule PO Daily  Cardizem CD 360 MG Oral Capsule Extended Release 24 Hour Oral  Glucosamine CAPS Oral  Klor-Con M20 20 MEQ Oral Tablet Extended Release Oral  Lisinopril-Hydrochlorothiazide 20 mg-12.5 mg tablet  Losartan Potassium-HCTZ 100-25 MG Oral Tablet Oral  Magnesium  MetOLazone 5 MG Oral Tablet Oral  Potassium  Pravastatin Sodium 40 mg tablet  Sildenafil Citrate 20 mg tablet  Vitamin B12  Vitamin D2     GU PSH: Complex cystometrogram, w/ void pressure and urethral pressure profile studies, any technique - 10/10/2020 Complex Uroflow - 10/10/2020 Emg surf Electrd - 10/10/2020 Inject For cystogram - 10/10/2020 Intrabd voidng Press - 10/10/2020       PSH Notes: Hand Surgery, Appendectomy, Intestinal Surgery, Wrist Surgery (2002), screws in right hand (2007), Ankle recontruction (2012)   NON-GU PSH: Appendectomy - 2011 Hernia Repair Unlisted Procedure Intestine - 2011     GU PMH: Urinary Retention - 10/10/2020 Gross hematuria, I offered to change his foley and he declined. He will return as planned. He was inst to use neosporin around meatus and take some Azo as needed. 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, Hypotonic bladder, - 2014 BPH w/LUTS - 09/20/2020 Hydronephrosis - 09/20/2020 History of urolithiasis, Nephrolithiasis - 2014 Obstructive and reflux uropathy, Unspec, Obstructive uropathy - 2014 Renal cyst, Renal cyst, acquired, right - 2014      PMH Notes:  1898-05-28 00:00:00 - Note: Normal Routine History And Physical  Adult  2010-01-12 15:16:54 - Note: Gout  2010-01-12 15:16:54 - Note: Arthritis   NON-GU PMH: Personal history of other diseases of the circulatory system, History of hypertension -  2014 Arthritis GERD Gout Hypercholesterolemia Hypertension    FAMILY HISTORY: 1 Daughter - Daughter 3 Son's - Son Alzheimer's Disease - Mother Death In The Family Father - Runs In Family Death In The Family Mother - Runs In Family Emphysema - Father Family Health Status Number - Runs In Family nephrolithiasis - Father   SOCIAL HISTORY: Marital Status: Married Preferred Language: English; Ethnicity: Not Hispanic Or Latino; Race: White Current Smoking Status: Patient has never smoked.   Tobacco Use Assessment Completed: Used Tobacco in last 30 days? Social Drinker.  Drinks 2 caffeinated drinks per day. Patient's occupation is/was Retired.     Notes: Alcohol Use, Tobacco Use, Occupation:, Caffeine Use, Marital History - Currently Married   REVIEW OF SYSTEMS:    GU Review Male:   Patient denies frequent urination, hard to postpone urination, burning/ pain with urination, get up at night to urinate, leakage of urine, stream starts and stops, trouble starting your stream, have to strain to urinate , erection problems, and penile pain.  Gastrointestinal (Upper):   Patient denies nausea, vomiting, and indigestion/ heartburn.  Gastrointestinal (Lower):   Patient denies diarrhea and constipation.  Constitutional:   Patient denies fever, night sweats, weight loss, and fatigue.  Skin:   Patient denies itching and skin rash/ lesion.  Eyes:   Patient denies blurred vision and double vision.  Ears/ Nose/ Throat:   Patient denies sore throat and sinus problems.  Hematologic/Lymphatic:   Patient denies swollen glands and easy bruising.  Cardiovascular:   Patient denies leg swelling and chest pains.  Respiratory:   Patient denies cough and shortness of breath.  Endocrine:   Patient denies excessive thirst.  Musculoskeletal:   Patient denies back pain and joint pain.  Neurological:   Patient denies headaches and dizziness.  Psychologic:   Patient denies depression and anxiety.   VITAL SIGNS:       10/17/2020 08:37 AM  Weight 166 lb / 75.3 kg  Height 67 in / 170.18 cm  BP 143/83 mmHg  Pulse 75 /min  Temperature 97.3 F / 36.2 C  BMI 26.0 kg/m   GU PHYSICAL EXAMINATION:    Scrotum: No lesions. No edema. No cysts. No warts.  Urethral Meatus: Normal size. No lesion, no wart, no discharge, no polyp. Normal location.  Penis: Penile foley catheter present - draining clear-yellow urine. Circumcised, no foreskin warts, no cracks. No dorsal peyronie's plaques, no left corporal peyronie's plaques, no right corporal peyronie's plaques, no scarring, no shaft warts. No balanitis, no meatal stenosis.    MULTI-SYSTEM PHYSICAL EXAMINATION:    Constitutional: Well-nourished. No physical deformities. Normally developed. Good grooming.  Neck: Neck symmetrical, not swollen. Normal tracheal position.  Respiratory: No labored breathing, no use of accessory muscles.   Cardiovascular: Normal temperature, normal extremity pulses, no swelling, no varicosities.  Skin: No paleness, no jaundice, no cyanosis. No lesion, no ulcer, no rash.  Neurologic / Psychiatric: Oriented to time, oriented to place, oriented to person. No depression, no anxiety, no agitation.  Gastrointestinal: No mass, no tenderness, no rigidity, non obese abdomen.     Complexity of Data:  Records Review:   Previous Patient Records  Urodynamics Review:   Review Urodynamics Tests   PROCEDURES:         Renal Ultrasound - 85462  Right Kidney: Length: 12.1 cm  Depth: 7.6 cm Cortical Width: 2.1 cm Width: 6.1 cm  Left Kidney: Length: 13.2 cm Depth: 7.4 cm Cortical Width: 1.6 cm Width: 5.0 cm  Left Kidney/Ureter:  Moderate hydro  Right Kidney/Ureter:  Mild hydro  Bladder:  Foley Catheter      Patient confirmed No Neulasta OnPro Device.   Renal ultrasound today shows persistent left greater than right sided hydronephrosis, which will likely be a chronic process due to his longstanding history of profound incomplete bladder  emptying. No solid parenchymal renal lesions are seen, bilaterally. Slight thinning of the left renal parenchyma. Bladder is visualized with Foley catheter in place. No intravesical lesions are appreciated.         Urinalysis w/Scope - 81001 Dipstick Dipstick Cont'd Micro  Color: Yellow Bilirubin: Neg WBC/hpf: 0 - 5/hpf  Appearance: Slightly Cloudy Ketones: Neg RBC/hpf: 0 - 2/hpf  Specific Gravity: 1.020 Blood: Neg Bacteria: Mod (26-50/hpf)  pH: 7.5 Protein: 1+ Cystals: Amorph Phosphates  Glucose: Neg Urobilinogen: 0.2 Casts: NS (Not Seen)    Nitrites: Neg Trichomonas: Not Present    Leukocyte Esterase: Trace Mucous: Not Present      Epithelial Cells: 0 - 5/hpf      Yeast: NS (Not Seen)      Sperm: Not Present    Notes:      ASSESSMENT:      ICD-10 Details  1 GU:   BPH w/LUTS - N40.1   2   Incomplete bladder emptying - R39.14   3   Hydronephrosis - N13.0    PLAN:            Medications New Meds: Bactrim Ds 800 mg-160 mg tablet 1 tablet PO BID As Directed Start taking 3 days prior to surgery  #6  0 Refill(s)            Orders Labs Urine Culture  X-Rays: Renal Ultrasound          Schedule Return Visit/Planned Activity: Next Available Appointment - Schedule Surgery          Document Letter(s):  Created for Patient: Clinical Summary         Notes:   -Renal ultrasound today shows persistent, bilateral hydronephrosis, which is likely to be a chronic process for him given his longstanding history of bladder outlet obstruction.  -UDS results discussed with the patient  -The risks, benefits and alternatives of cystoscopy with TURP was discussed with the patient. The risks included, but are not limited to, bleeding, urinary tract infection, bladder perforation requiring prolonged catheterization and/or open bladder repair, ureteral injury, ureteral obstruction, urethral stricture disease, new or worsening voiding dysfunction, retrograde ejaculation, MI, CVA, PE, DVT and the  inherent risks of general anesthesia. We also discussed the need for Foley catheterization for at least 3 days post-op and the likely need for post-op observation in the hospital following the procedure. The patient voices understanding and wishes to proceed.

## 2020-11-09 NOTE — Transfer of Care (Signed)
Immediate Anesthesia Transfer of Care Note  Patient: Richard Peterson.  Procedure(s) Performed: Procedure(s) (LRB): TRANSURETHRAL RESECTION OF THE PROSTATE (TURP)/ BIPOLAR (N/A)  Patient Location: PACU  Anesthesia Type: General  Level of Consciousness: awake, alert  and oriented  Airway & Oxygen Therapy: Patient Spontanous Breathing and Patient connected to nasal cannula oxygen  Post-op Assessment: Report given to PACU RN and Post -op Vital signs reviewed and stable  Post vital signs: Reviewed and stable  Complications: No apparent anesthesia complications  Last Vitals:  Vitals Value Taken Time  BP 111/86 11/09/20 1250  Temp 36.3 C 11/09/20 1250  Pulse 78 11/09/20 1253  Resp 15 11/09/20 1253  SpO2 98 % 11/09/20 1253  Vitals shown include unvalidated device data.  Last Pain:  Vitals:   11/09/20 0852  TempSrc: Oral  PainSc: 0-No pain      Patients Stated Pain Goal: 4 (47/12/52 7129)  Complications: No notable events documented.

## 2020-11-10 ENCOUNTER — Encounter (HOSPITAL_BASED_OUTPATIENT_CLINIC_OR_DEPARTMENT_OTHER): Payer: Self-pay | Admitting: Urology

## 2020-11-10 DIAGNOSIS — N401 Enlarged prostate with lower urinary tract symptoms: Secondary | ICD-10-CM | POA: Diagnosis not present

## 2020-11-10 LAB — BASIC METABOLIC PANEL
Anion gap: 8 (ref 5–15)
BUN: 15 mg/dL (ref 8–23)
CO2: 24 mmol/L (ref 22–32)
Calcium: 8.8 mg/dL — ABNORMAL LOW (ref 8.9–10.3)
Chloride: 101 mmol/L (ref 98–111)
Creatinine, Ser: 1.11 mg/dL (ref 0.61–1.24)
GFR, Estimated: >60 mL/min (ref >60)
Glucose, Bld: 165 mg/dL — ABNORMAL HIGH (ref 70–99)
Potassium: 3.5 mmol/L (ref 3.5–5.1)
Sodium: 133 mmol/L — ABNORMAL LOW (ref 135–145)

## 2020-11-10 LAB — HEMOGLOBIN AND HEMATOCRIT, BLOOD
HCT: 42.3 % (ref 39.0–52.0)
Hemoglobin: 14.3 g/dL (ref 13.0–17.0)

## 2020-11-10 LAB — HIV ANTIBODY (ROUTINE TESTING W REFLEX): HIV Screen 4th Generation wRfx: NONREACTIVE

## 2020-11-10 LAB — SURGICAL PATHOLOGY

## 2020-11-10 MED ORDER — ACETAMINOPHEN 325 MG PO TABS
ORAL_TABLET | ORAL | Status: AC
  Filled 2020-11-10: qty 2

## 2020-11-10 MED ORDER — TAMSULOSIN HCL 0.4 MG PO CAPS
ORAL_CAPSULE | ORAL | Status: AC
  Filled 2020-11-10: qty 1

## 2020-11-10 MED ORDER — CIPROFLOXACIN HCL 500 MG PO TABS
ORAL_TABLET | ORAL | Status: AC
  Filled 2020-11-10: qty 1

## 2020-11-10 MED ORDER — CIPROFLOXACIN HCL 500 MG PO TABS: 500.0000 mg | ORAL_TABLET | Freq: Two times a day (BID) | ORAL | 0 refills | Status: AC

## 2020-11-10 MED ORDER — PHENAZOPYRIDINE HCL 200 MG PO TABS
200.0000 mg | ORAL_TABLET | Freq: Three times a day (TID) | ORAL | 0 refills | Status: DC | PRN
Start: 2020-11-10 — End: 2021-08-09

## 2020-11-10 MED ORDER — OXYBUTYNIN CHLORIDE 5 MG PO TABS: 5.0000 mg | ORAL_TABLET | Freq: Three times a day (TID) | ORAL | 1 refills | Status: DC | PRN

## 2020-11-10 MED ORDER — TRAMADOL HCL 50 MG PO TABS: 50.0000 mg | ORAL_TABLET | Freq: Four times a day (QID) | ORAL | 0 refills | Status: AC | PRN

## 2020-11-21 NOTE — Discharge Summary (Signed)
Date of admission: 11/09/2020  Date of discharge: 11/10/2020  Admission diagnosis: BPH with LUTS  Discharge diagnosis: Same  Surgeries: Bipolar TURP  History and Physical: For full details, please see admission history and physical. Briefly, Richard Bruins Earnhart Brooke Bonito. is a 70 y.o. year old patient with a history of BPH with LUTS.   CT from April 2022 revealed bilateral hydronephrosis as well as a severely distended bladder likely secondary to chronic bladder outlet obstruction.  Hospital Course: Following TURP on 11/09/2020, the patient was monitored on the floor with CBI.  On postoperative day 1, the patient's urine was clear to light pink with minimal CBI.  He was discharged home with his Foley catheter with plans for a voiding trial in approximately 5 days.  Physical Exam:  General: Alert and oriented CV: RRR, palpable distal pulses Lungs: CTAB, equal chest rise Abdomen: Soft, NTND, no rebound or guarding GU: Three-way Foley catheter in place and draining clear to light pink urine with minimal CBI Ext: NT, No erythema  Laboratory values: No results for input(s): HGB, HCT in the last 72 hours. No results for input(s): CREATININE in the last 72 hours.  Disposition: Home  Discharge instruction: The patient was instructed to be ambulatory but told to refrain from heavy lifting, strenuous activity, or driving.  Discharge medications:  Allergies as of 11/10/2020       Reactions   Oxycodone Itching        Medication List     STOP taking these medications    MAGNESIUM PO       TAKE these medications    B-12 PO Take 1 tablet by mouth daily.   ergocalciferol 1.25 MG (50000 UT) capsule Commonly known as: VITAMIN D2 Take 50,000 Units by mouth once a week. Friday's   lisinopril-hydrochlorothiazide 20-25 MG tablet Commonly known as: ZESTORETIC Take 1 tablet by mouth daily.   Metamucil 28.3 % Powd Generic drug: Psyllium Take by mouth. 5 times per week   oxybutynin 5 MG  tablet Commonly known as: DITROPAN Take 1 tablet (5 mg total) by mouth every 8 (eight) hours as needed for bladder spasms.   phenazopyridine 200 MG tablet Commonly known as: Pyridium Take 1 tablet (200 mg total) by mouth 3 (three) times daily as needed (for pain with urination).   potassium chloride 10 MEQ tablet Commonly known as: KLOR-CON Take 30 mEq by mouth daily.   pravastatin 40 MG tablet Commonly known as: PRAVACHOL Take 40 mg by mouth daily.   tamsulosin 0.4 MG Caps capsule Commonly known as: FLOMAX Take 0.4 mg by mouth daily after breakfast.       ASK your doctor about these medications    ciprofloxacin 500 MG tablet Commonly known as: Cipro Take 1 tablet (500 mg total) by mouth 2 (two) times daily for 3 days. Start taking on 11/13/20 Ask about: Should I take this medication?   traMADol 50 MG tablet Commonly known as: Ultram Take 1 tablet (50 mg total) by mouth every 6 (six) hours as needed for up to 3 days. Ask about: Should I take this medication?        Followup:   Follow-up Information     [facility] Follow up on 11/14/2020.   Why: Catheter removal at [place] information: [place] [phone]

## 2020-12-02 DIAGNOSIS — R3914 Feeling of incomplete bladder emptying: Secondary | ICD-10-CM | POA: Diagnosis not present

## 2020-12-07 DIAGNOSIS — L720 Epidermal cyst: Secondary | ICD-10-CM | POA: Diagnosis not present

## 2021-01-25 DIAGNOSIS — Z125 Encounter for screening for malignant neoplasm of prostate: Secondary | ICD-10-CM | POA: Diagnosis not present

## 2021-01-25 DIAGNOSIS — E7800 Pure hypercholesterolemia, unspecified: Secondary | ICD-10-CM | POA: Diagnosis not present

## 2021-01-25 DIAGNOSIS — I10 Essential (primary) hypertension: Secondary | ICD-10-CM | POA: Diagnosis not present

## 2021-01-25 DIAGNOSIS — E559 Vitamin D deficiency, unspecified: Secondary | ICD-10-CM | POA: Diagnosis not present

## 2021-01-25 DIAGNOSIS — N529 Male erectile dysfunction, unspecified: Secondary | ICD-10-CM | POA: Diagnosis not present

## 2021-01-25 DIAGNOSIS — Z0000 Encounter for general adult medical examination without abnormal findings: Secondary | ICD-10-CM | POA: Diagnosis not present

## 2021-03-06 DIAGNOSIS — R3914 Feeling of incomplete bladder emptying: Secondary | ICD-10-CM | POA: Diagnosis not present

## 2021-03-06 DIAGNOSIS — N401 Enlarged prostate with lower urinary tract symptoms: Secondary | ICD-10-CM | POA: Diagnosis not present

## 2021-03-06 DIAGNOSIS — N130 Hydronephrosis with ureteropelvic junction obstruction: Secondary | ICD-10-CM | POA: Diagnosis not present

## 2021-03-20 DIAGNOSIS — H43811 Vitreous degeneration, right eye: Secondary | ICD-10-CM | POA: Diagnosis not present

## 2021-08-08 ENCOUNTER — Telehealth: Payer: Self-pay

## 2021-08-08 NOTE — Telephone Encounter (Signed)
NOTES SCANNED TO REFERRAL 

## 2021-08-09 ENCOUNTER — Other Ambulatory Visit: Payer: Self-pay

## 2021-08-09 ENCOUNTER — Encounter: Payer: Self-pay | Admitting: Interventional Cardiology

## 2021-08-09 ENCOUNTER — Ambulatory Visit: Payer: PPO | Admitting: Interventional Cardiology

## 2021-08-09 VITALS — BP 142/80 | HR 69 | Ht 66.0 in | Wt 167.0 lb

## 2021-08-09 DIAGNOSIS — I10 Essential (primary) hypertension: Secondary | ICD-10-CM

## 2021-08-09 DIAGNOSIS — I700 Atherosclerosis of aorta: Secondary | ICD-10-CM | POA: Diagnosis not present

## 2021-08-09 DIAGNOSIS — I491 Atrial premature depolarization: Secondary | ICD-10-CM | POA: Diagnosis not present

## 2021-08-09 NOTE — Patient Instructions (Addendum)
Medication Instructions:  ?Your physician recommends that you continue on your current medications as directed. Please refer to the Current Medication list given to you today. ? ?*If you need a refill on your cardiac medications before your next appointment, please call your pharmacy* ? ? ?Lab Work: ?none ?If you have labs (blood work) drawn today and your tests are completely normal, you will receive your results only by: ?MyChart Message (if you have MyChart) OR ?A paper copy in the mail ?If you have any lab test that is abnormal or we need to change your treatment, we will call you to review the results. ? ? ?Testing/Procedures: ?none ? ? ?Follow-Up: ?At [facility], you and your health needs are our priority.  As part of our continuing mission to provide you with exceptional heart care, we have created designated Provider Care Teams.  These Care Teams include your primary Cardiologist (physician) and Advanced Practice Providers (APPs -  Physician Assistants and Nurse Practitioners) who all work together to provide you with the care you need, when you need it. ? ?We recommend signing up for the patient portal called "MyChart".  Sign up information is provided on this After Visit Summary.  MyChart is used to connect with patients for Virtual Visits (Telemedicine).  Patients are able to view lab/test results, encounter notes, upcoming appointments, etc.  Non-urgent messages can be sent to your provider as well.   ?To learn more about what you can do with MyChart, go to [email or web address].   ? ?Your next appointment:   ?As needed  ? ?The format for your next appointment:   ?In Person ? ?Provider:   ?Larae Grooms, MD   ? ? ?Other Instructions ?High-Fiber Eating Plan ?Fiber, also called dietary fiber, is a type of carbohydrate. It is found foods such as fruits, vegetables, whole grains, and beans. A high-fiber diet can have many health benefits. Your health care provider may recommend a high-fiber diet to  help: ?Prevent constipation. Fiber can make your bowel movements more regular. ?Lower your cholesterol. ?Relieve the following conditions: ?Inflammation of veins in the anus (hemorrhoids). ?Inflammation of specific areas of the digestive tract (uncomplicated diverticulosis). ?A problem of the large intestine, also called the colon, that sometimes causes pain and diarrhea (irritable bowel syndrome, or IBS). ?Prevent overeating as part of a weight-loss plan. ?Prevent heart disease, type 2 diabetes, and certain cancers. ?What are tips for following this plan? ?Reading food labels ? ?Check the nutrition facts label on food products for the amount of dietary fiber. Choose foods that have 5 grams of fiber or more per serving. ?The goals for recommended daily fiber intake include: ?Men (age 2 or younger): 34-38 g. ?Men (over age 51): 28-34 g. ?Women (age 38 or younger): 25-28 g. ?Women (over age 61): 22-25 g. ?Your daily fiber goal is _____________ g. ?Shopping ?Choose whole fruits and vegetables instead of processed forms, such as apple juice or applesauce. ?Choose a wide variety of high-fiber foods such as avocados, lentils, oats, and kidney beans. ?Read the nutrition facts label of the foods you choose. Be aware of foods with added fiber. These foods often have high sugar and sodium amounts per serving. ?Cooking ?Use whole-grain flour for baking and cooking. ?Cook with brown rice instead of white rice. ?Meal planning ?Start the day with a breakfast that is high in fiber, such as a cereal that contains 5 g of fiber or more per serving. ?Eat breads and cereals that are made with whole-grain flour instead of  refined flour or white flour. ?Eat brown rice, bulgur wheat, or millet instead of white rice. ?Use beans in place of meat in soups, salads, and pasta dishes. ?Be sure that half of the grains you eat each day are whole grains. ?General information ?You can get the recommended daily intake of dietary fiber by: ?Eating a  variety of fruits, vegetables, grains, nuts, and beans. ?Taking a fiber supplement if you are not able to take in enough fiber in your diet. It is better to get fiber through food than from a supplement. ?Gradually increase how much fiber you consume. If you increase your intake of dietary fiber too quickly, you may have bloating, cramping, or gas. ?Drink plenty of water to help you digest fiber. ?Choose high-fiber snacks, such as berries, raw vegetables, nuts, and popcorn. ?What foods should I eat? ?Fruits ?Berries. Pears. Apples. Oranges. Avocado. Prunes and raisins. Dried figs. ?Vegetables ?Sweet potatoes. Spinach. Kale. Artichokes. Cabbage. Broccoli. Cauliflower. Green peas. Carrots. Squash. ?Grains ?Whole-grain breads. Multigrain cereal. Oats and oatmeal. Brown rice. Barley. Bulgur wheat. Elbert. Quinoa. Bran muffins. Popcorn. Rye wafer crackers. ?Meats and other proteins ?Navy beans, kidney beans, and pinto beans. Soybeans. Split peas. Lentils. Nuts and seeds. ?Dairy ?Fiber-fortified yogurt. ?Beverages ?Fiber-fortified soy milk. Fiber-fortified orange juice. ?Other foods ?Fiber bars. ?The items listed above may not be a complete list of recommended foods and beverages. Contact a dietitian for more information. ?What foods should I avoid? ?Fruits ?Fruit juice. Cooked, strained fruit. ?Vegetables ?Fried potatoes. Canned vegetables. Well-cooked vegetables. ?Grains ?White bread. Pasta made with refined flour. White rice. ?Meats and other proteins ?Fatty cuts of meat. Fried chicken or fried fish. ?Dairy ?Milk. Yogurt. Cream cheese. Sour cream. ?Fats and oils ?Butters. ?Beverages ?Soft drinks. ?Other foods ?Cakes and pastries. ?The items listed above may not be a complete list of foods and beverages to avoid. Talk with your dietitian about what choices are best for you. ?Summary ?Fiber is a type of carbohydrate. It is found in foods such as fruits, vegetables, whole grains, and beans. ?A high-fiber diet has many  benefits. It can help to prevent constipation, lower blood cholesterol, aid weight loss, and reduce your risk of heart disease, diabetes, and certain cancers. ?Increase your intake of fiber gradually. Increasing fiber too quickly may cause cramping, bloating, and gas. Drink plenty of water while you increase the amount of fiber you consume. ?The best sources of fiber include whole fruits and vegetables, whole grains, nuts, seeds, and beans. ?This information is not intended to replace advice given to you by your health care provider. Make sure you discuss any questions you have with your health care provider. ?Document Revised: 09/17/2019 Document Reviewed: 09/17/2019 ?Elsevier Patient Education ? 2022 [place]. ? ? ?

## 2021-08-09 NOTE — Progress Notes (Signed)
?  ?Cardiology Office Note ? ? ?Date:  08/09/2021  ? ?ID:  Richard Peterson., DOB 09/12/1950, MRN [id number] ? ?PCP:  Stephens Shire, MD  ? ? ?No chief complaint on file. ? ?PACs ? ?Wt Readings from Last 3 Encounters:  ?08/09/21 167 lb (75.8 kg)  ?11/09/20 167 lb 9.6 oz (76 kg)  ?11/07/20 170 lb (77.1 kg)  ?  ? ?  ?History of Present Illness: ?Richard Peterson. is a 71 y.o. male who is being seen today for the evaluation of irregular heart rhythm at the request of Armstrong, Creasy, *.  ? ?She has had HTN for many years.  He has been on medicines.  ? ?He developed a hernia last year.  He had enlarged bladder requiring catheter as well after CT scan in 2022.  ? ?Frequent PACs noted on ECG at PMD.  ? ?He report some low BP readings.  He has had variable pulse readings as well.  ? ?07/2020 CT showed: "Atherosclerotic ?calcification of the aorta, aortic valve and coronary arteries. ?Heart size normal. No pericardial or pleural effusion." ? ?Denies : Chest pain. Dizziness. Leg edema. Nitroglycerin use. Orthopnea. Palpitations. Paroxysmal nocturnal dyspnea. Shortness of breath. Syncope.  ? ?Walks twice a week for exercise.  He does a lot of gardening.  ? ? ? ? ? ?Past Medical History:  ?Diagnosis Date  ? Arthritis   ? hand, back  ? Benign localized prostatic hyperplasia with lower urinary tract symptoms (LUTS)   ? ED (erectile dysfunction)   ? Foley catheter in place   ? Hyperlipidemia   ? Hypertension   ? followed by pcp  ? ? ?Past Surgical History:  ?Procedure Laterality Date  ? ANKLE/ FOOT RECONSTRUCTION Left 2012  ? involving heel, arch, tendon, debridement;  per pt has retained hardware  ? APPENDECTOMY    ? child  ? CATARACT EXTRACTION W/ INTRAOCULAR LENS  IMPLANT, BILATERAL Bilateral 2020  ? COLON SURGERY  1955  ? 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 ORIF FINGER FRACTURE Right 2009  ? 4th and 5th finger's,  retained hardware  ? TRANSURETHRAL RESECTION OF PROSTATE N/A 11/09/2020  ?  Procedure: TRANSURETHRAL RESECTION OF THE PROSTATE (TURP)/ BIPOLAR;  Surgeon: Ceasar Mons, MD;  Location: [facility];  Service: Urology;  Laterality: N/A;  ? UMBILICAL HERNIA REPAIR  01/19/2011  ? '@SCG'$   ? WRIST SURGERY Left 2004  ? 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 ? ? ?Current Outpatient Medications  ?Medication Sig Dispense Refill  ? Cyanocobalamin (B-12 PO) Take 1 tablet by mouth daily.    ? ergocalciferol (VITAMIN D2) 1.25 MG (50000 UT) capsule Take 50,000 Units by mouth once a week. Friday's    ? lisinopril-hydrochlorothiazide (ZESTORETIC) 20-25 MG tablet Take 1 tablet by mouth daily.    ? Magnesium Oxide 500 MG TABS Take 1,000 mg by mouth daily.    ? potassium chloride (KLOR-CON) 10 MEQ tablet Take 30 mEq by mouth daily.    ? sildenafil (REVATIO) 20 MG tablet TAKE 1-3 TABS AS NEEDED PRIOR TO SEXUAL ACTIVITY    ? ?No current facility-administered medications for this visit.  ? ? ?Allergies:   Oxycodone  ? ? ?Social History:  The patient  reports that he has never smoked. He quit smokeless tobacco use about 11 years ago. He reports that he does not currently use alcohol. He reports that he  does not use drugs.  ? ?Family History:  The patient's family history includes Emphysema in his mother; High blood pressure in his father.  ? ? ?ROS:  Please see the history of present illness.   Otherwise, review of systems are positive for occasional BP increases with some pressure in his eyes.   All other systems are reviewed and negative.  ? ? ?PHYSICAL EXAM: ?VS:  BP (!) 142/80   Pulse 69   Ht '5\' 6"'$  (1.676 m)   Wt 167 lb (75.8 kg)   SpO2 96%   BMI 26.95 kg/m?  , BMI Body mass index is 26.95 kg/m?. ?GEN: Well nourished, well developed, in no acute distress ?HEENT: normal ?Neck: no JVD, carotid bruits, or masses ?Cardiac: RRR; no murmurs, rubs, or gallops,no edema  ?Respiratory:  clear to auscultation bilaterally, normal work of breathing ?GI: soft, nontender,  nondistended, + BS ?MS: no deformity or atrophy ?Skin: warm and dry, no rash ?Neuro:  Strength and sensation are intact ?Psych: euthymic mood, full affect ? ? ?EKG:   ?The ekg ordered today demonstrates NSR, incomplete RBBB ? ? ?Recent Labs: ?11/07/2020: Platelets 202 ?11/10/2020: BUN 15; Creatinine, Ser 1.11; Hemoglobin 14.3; Potassium 3.5; Sodium 133  ? ?Lipid Panel ?No results found for: CHOL, TRIG, HDL, CHOLHDL, VLDL, LDLCALC, LDLDIRECT ?  ?Other studies Reviewed: ?Additional studies/ records that were reviewed today with results demonstrating: Chol 183, HDL 84, LDL80, TG 71 in 12/2020. ? ? ?ASSESSMENT AND PLAN: ? ?HTN: Home readings typically well controlled.  Some readings up to the 140-150 range. Counseled him to rest for 10 minutes prior to checking BP.  He can try to minimize salt in his diet.  Had been eating more crackers of late. ?PACs: Resolved today.  No sx of palpitations.  No ischemic sx. no further work-up required at this time.  He will let us know if he has any symptoms.  Could consider outpatient monitor if he did have symptoms.  Avoid excess caffeine.  ?Aortic atherosclerosis:Recommend statin.  He prefers not to take as statin.  Whole food, plant-based diet.  High-fiber into the.Avoid processed food. ? ? ?Current medicines are reviewed at length with the patient today.  The patient concerns regarding his medicines were addressed. ? ?The following changes have been made:  No change ? ?Labs/ tests ordered today include:  ?No orders of the defined types were placed in this encounter. ? ? ?Recommend 150 minutes/week of aerobic exercise ?Low fat, low carb, high fiber diet recommended ? ?Disposition:   FU as needed ? ? ?Signed, ?Larae Grooms, MD  ?08/09/2021 11:20 AM    ?[address] ?[address], [place], [place]  [address] ?Phone: [phone]; Fax: [phone]  ? ?

## 2022-07-23 IMAGING — US US ABDOMEN LIMITED
1 series · 9 of 9 positions shown · non-contrast
Comparison: None

CLINICAL DATA: Periumbilical abdominal swelling. Concern for
hernia.

EXAM:
ULTRASOUND ABDOMEN LIMITED

[Series 1: us abdomen limited · 0.05mm/px · 9 of 9 slices shown]
[im 1/9]
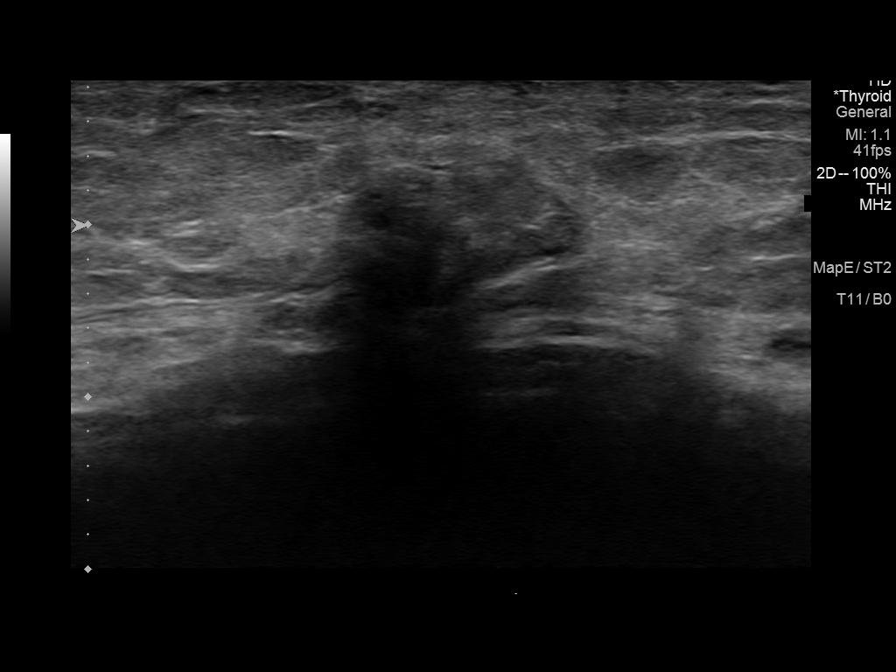
[im 2/9]
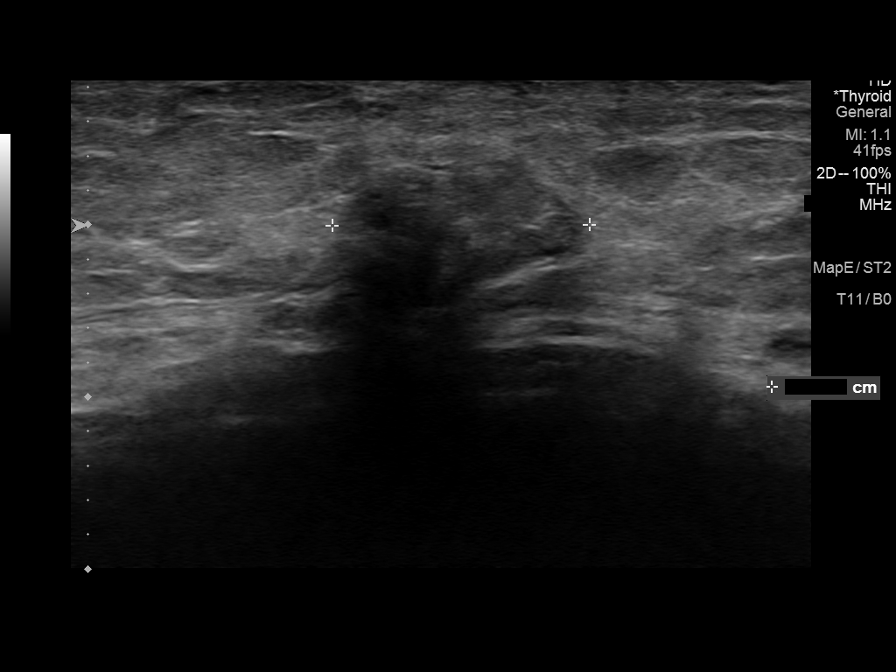
[im 3/9]
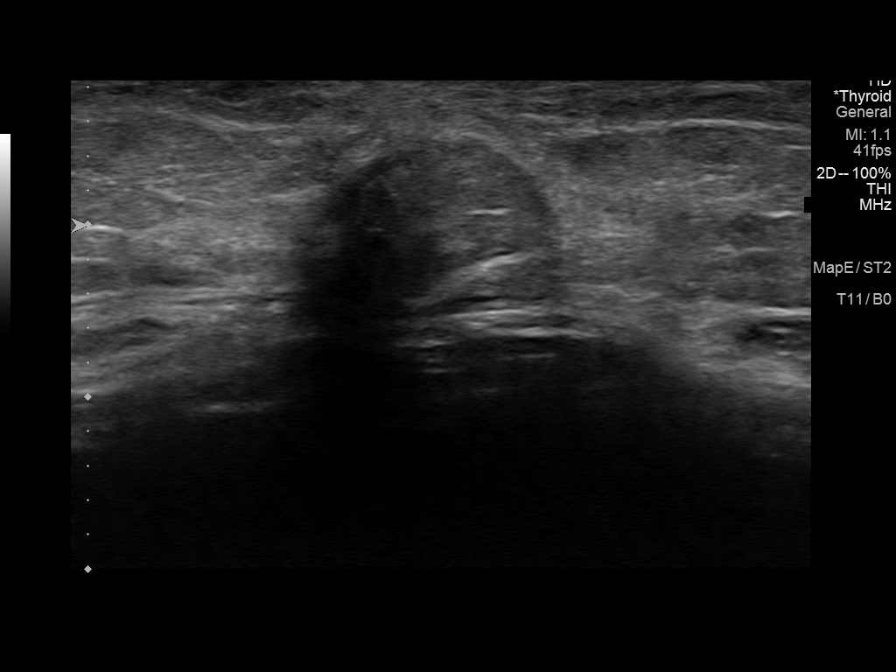
[im 4/9]
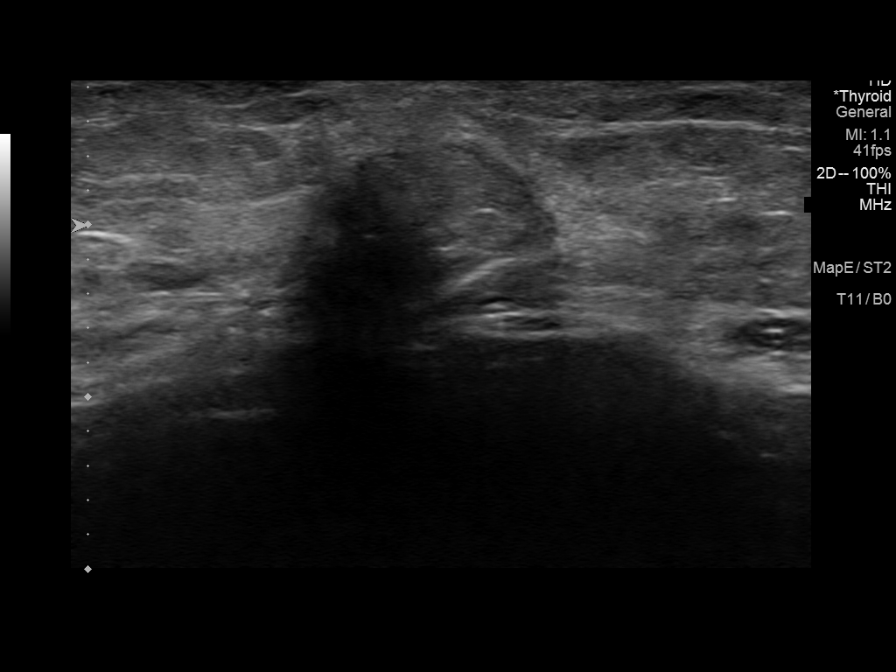
[im 5/9]
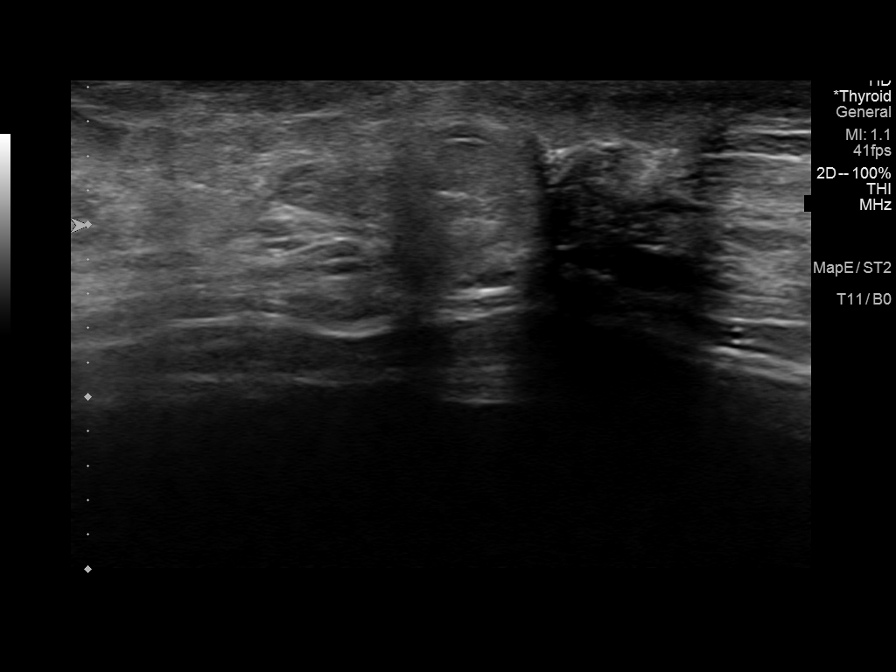
[im 6/9]
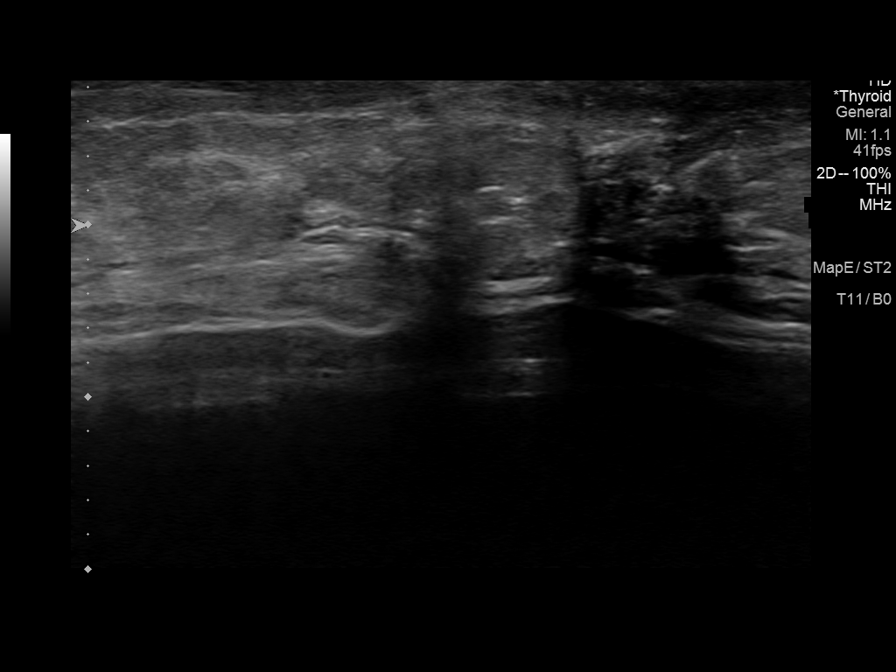
[im 7/9]
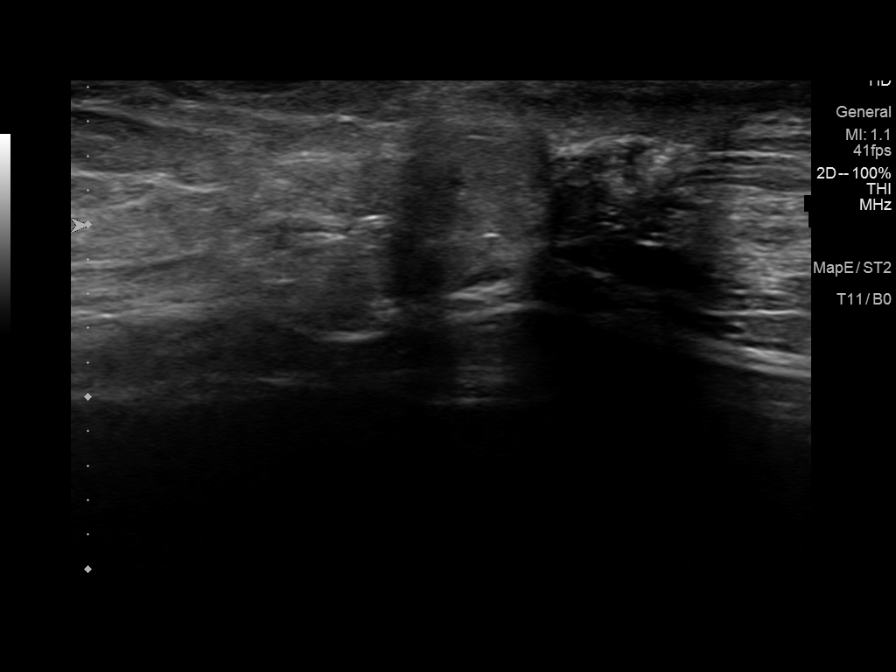
[im 8/9]
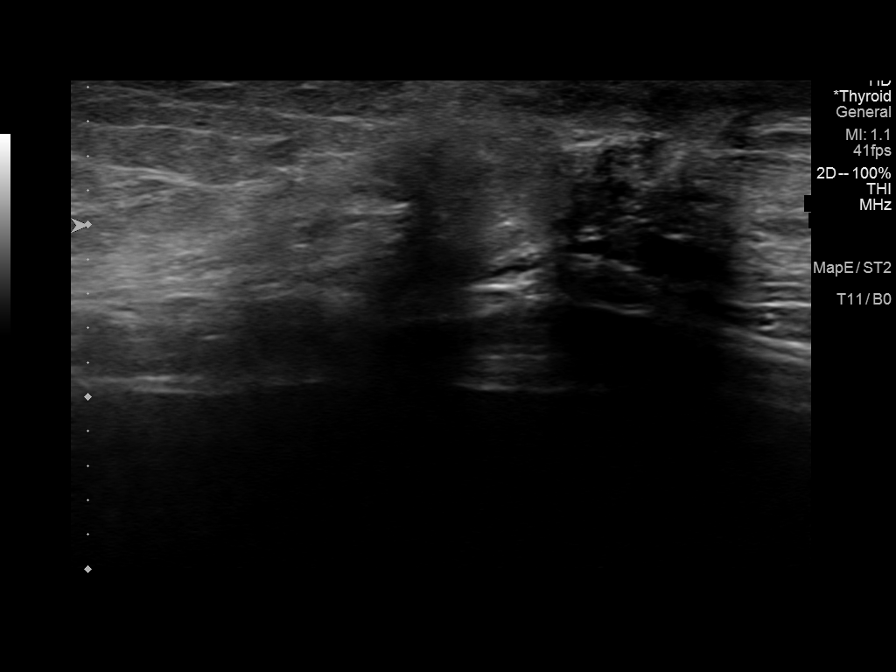
[im 9/9]
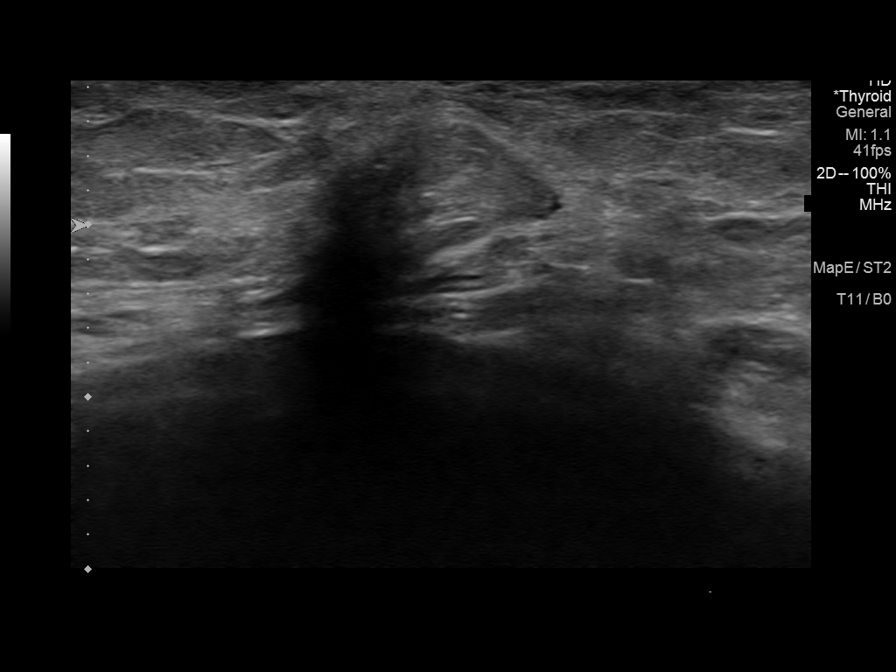

[9 of 9 positions shown; findings below may reference images not displayed]

FINDINGS: Sonographic evaluation superior to the umbilicus demonstrates a fat
containing hernia. The hernia sac measures approximately 1.5 cm in
width. The abdominal wall defect is difficult to fully evaluate.
IMPRESSION: Fat containing umbilical hernia superior to the umbilicus as
detailed above.

## 2022-09-05 IMAGING — CT CT ABD-PELV W/O CM
2 of 4 series · 16 of 46 positions shown, 18 images · non-contrast
Comparison: None.

CLINICAL DATA: Abdominal swelling for 8 weeks. History of hernia
repair.

EXAM:
CT ABDOMEN AND PELVIS WITHOUT CONTRAST
TECHNIQUE: Multidetector CT imaging of the abdomen and pelvis was performed
following the standard protocol without IV contrast.

[Series 2: routine abdomen pelvis without 5.00 br40 s3 axial · axial · non-contrast · 0.84mm/px · z∈[+1291,+1711]mm · 13 of 92 slices shown, 15 images]
[im 4/92  soft-tissue]
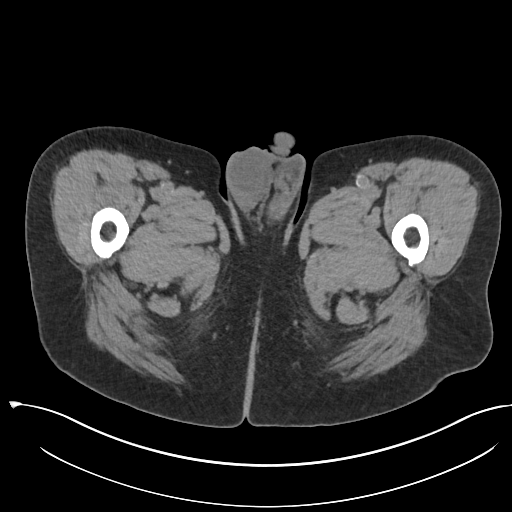
[im 4/92  bone]
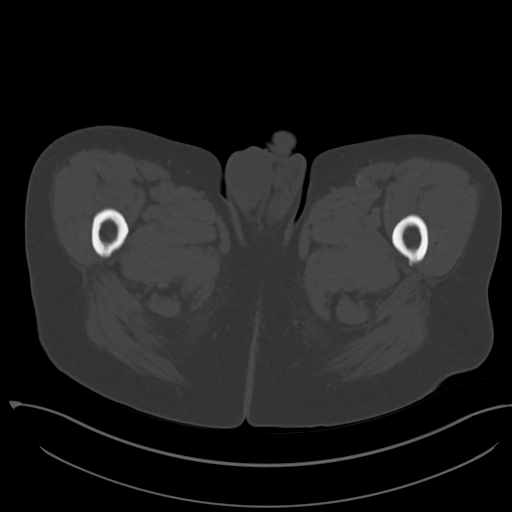
[im 11/92  soft-tissue]
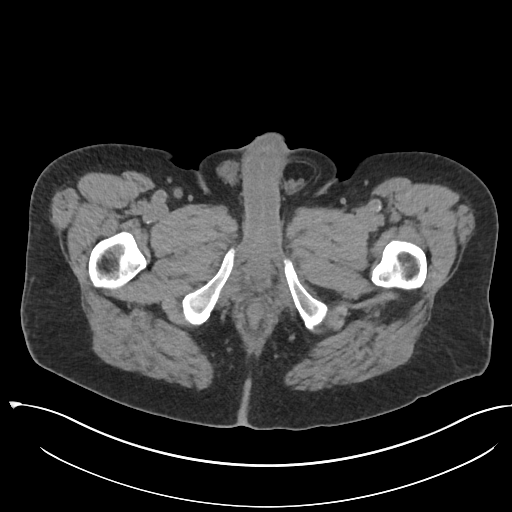
[im 19/92  soft-tissue]
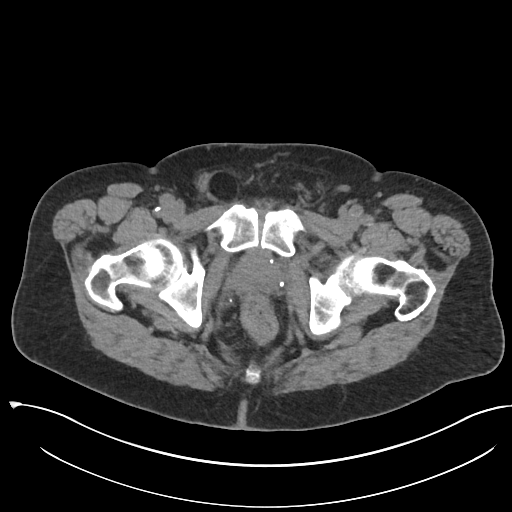
[im 26/92  soft-tissue]
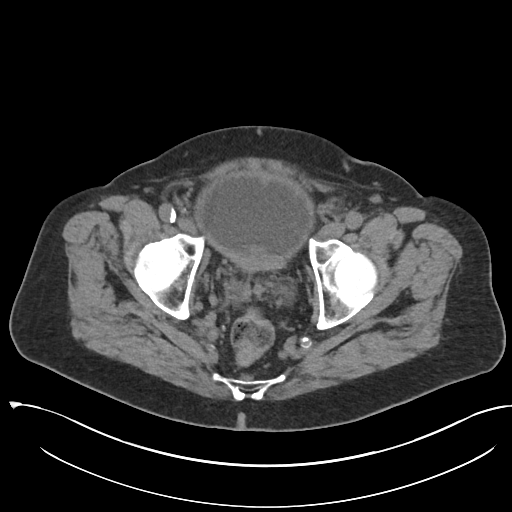
[im 33/92  soft-tissue]
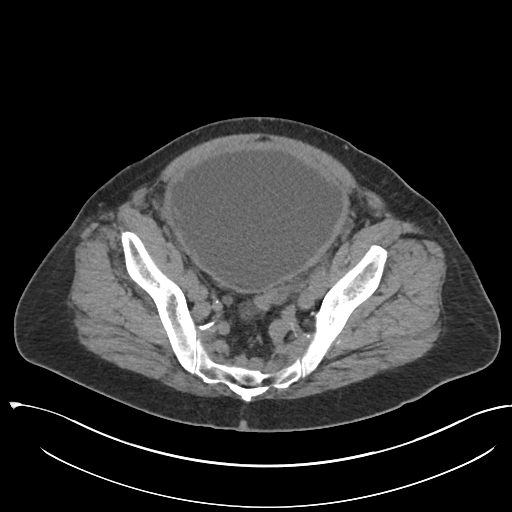
[im 41/92  soft-tissue]
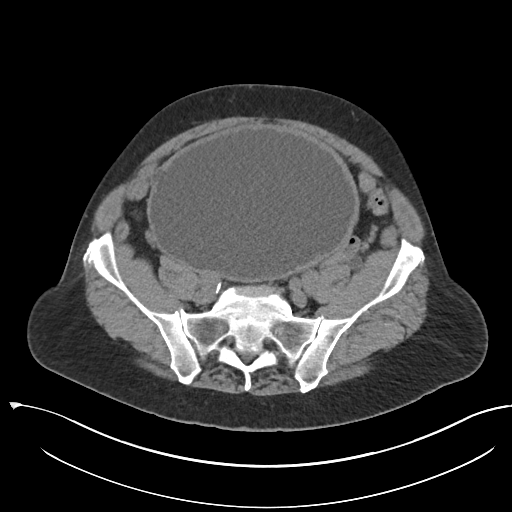
[im 48/92  soft-tissue]
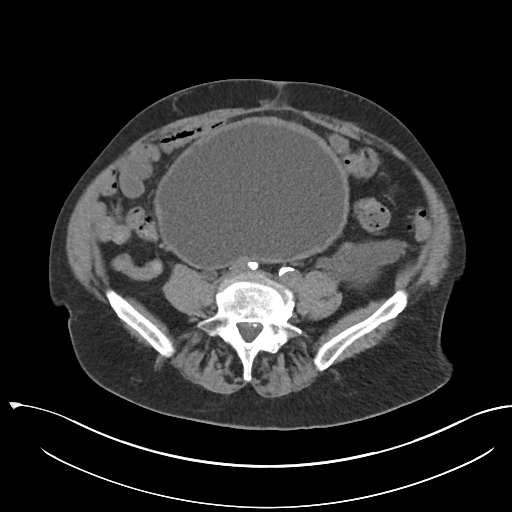
[im 51/92  soft-tissue]
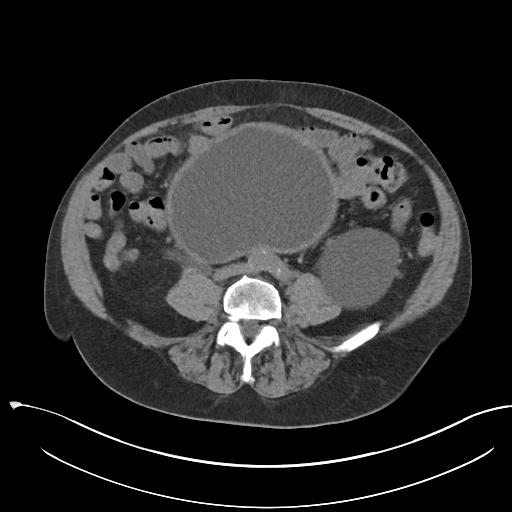
[im 59/92  soft-tissue]
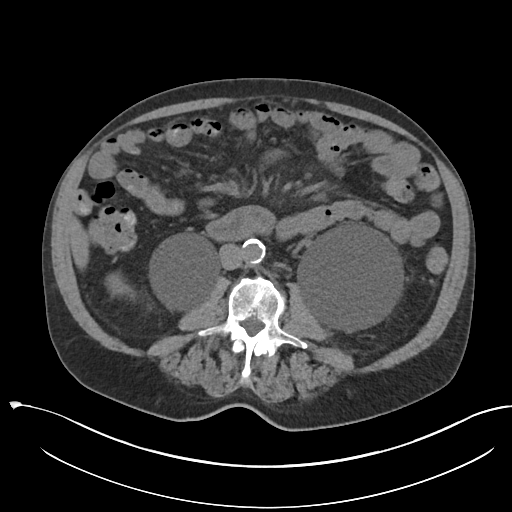
[im 59/92  bone]
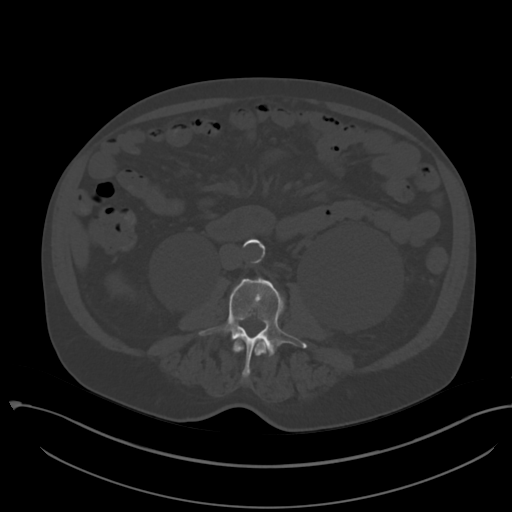
[im 66/92  soft-tissue]
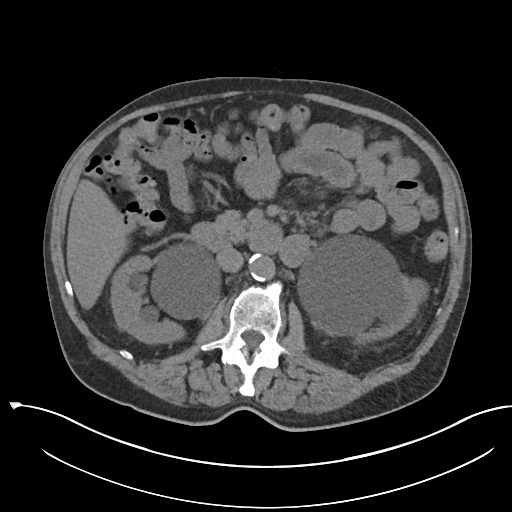
[im 73/92  soft-tissue]
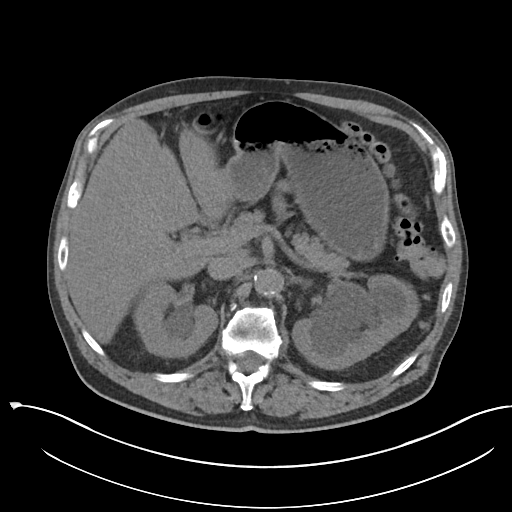
[im 81/92  soft-tissue]
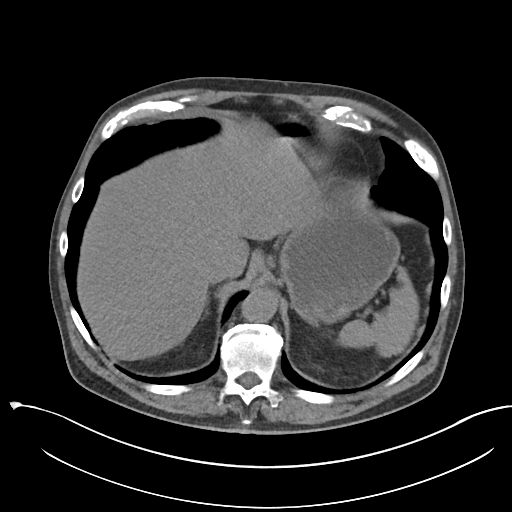
[im 88/92  soft-tissue]
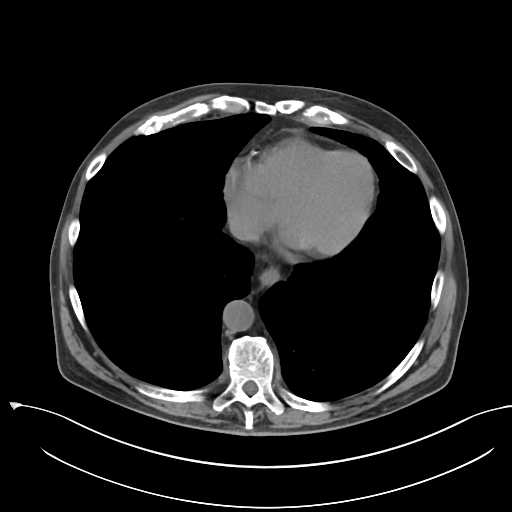

[Series 4: routine abdomen pelvis without 2.00 br40 s3 cor · coronal · non-contrast · 0.84mm/px · 3 of 215 slices shown]
[im 72/215  soft-tissue]
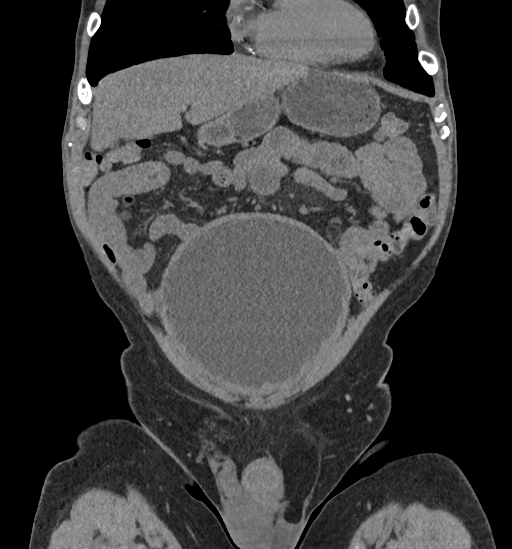
[im 96/215  soft-tissue]
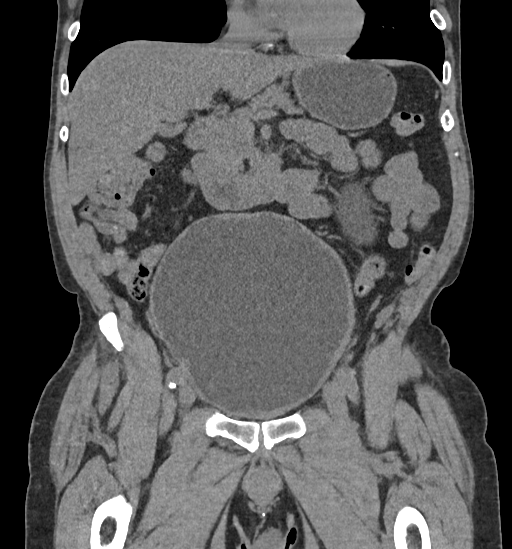
[im 119/215  soft-tissue]
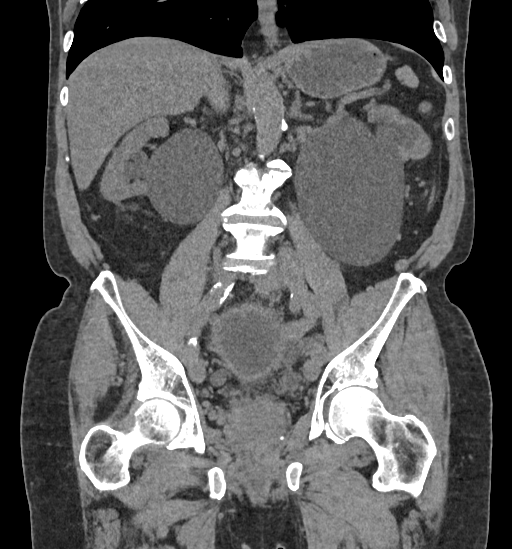

[16 of 46 positions shown; findings below may reference images not displayed]

FINDINGS: Lower chest: Smudgy 5 mm nodule along the right major fissure is
likely a subpleural lymph node. Clustered peribronchovascular
nodularity in the lateral segment right middle lobe, likely
postinfectious in etiology. Minimal scarring in the medial left
lower lobe. Lung bases are otherwise clear. Atherosclerotic
calcification of the aorta, aortic valve and coronary arteries.
Heart size normal. No pericardial or pleural effusion. Distal
esophagus is unremarkable.

Hepatobiliary: Liver and gallbladder are unremarkable. No biliary
ductal dilatation.

Pancreas: Negative.

Spleen: Negative.

Adrenals/Urinary Tract: Adrenal glands are unremarkable. Severe
bilateral hydronephrosis. Right kidney is otherwise unremarkable.
Slight parenchymal thinning involving the left kidney. No urinary
stones. Bladder is slightly thick-walled, irregular and markedly
distended with urine.

Stomach/Bowel: Stomach, small bowel and colon are unremarkable.
Appendix is reportedly surgically absent.

Vascular/Lymphatic: Atherosclerotic calcification of the aorta. No
pathologically enlarged lymph nodes.

Reproductive: Prostate is normal in size.

Other: No free fluid. Right inguinal hernia contains fat.
Mesenteries and peritoneum are otherwise unremarkable.

Musculoskeletal: Degenerative changes in the spine. No worrisome
lytic or sclerotic lesions.
IMPRESSION: 1. Marked urinary retention with severe bilateral hydronephrosis and
left renal parenchymal thinning. Associated circumferential bladder
wall thickening. Prostate is not enlarged.
2. Aortic atherosclerosis ([id number]). Coronary artery
calcification.
